# Patient Record
Sex: Male | Born: 1980 | Race: White | Hispanic: No | Smoking: Never smoker
Health system: Southern US, Community
[De-identification: ages and names within clinical notes are randomized; demographics above are authoritative.]

## PROBLEM LIST (undated history)

## (undated) DIAGNOSIS — G4733 Obstructive sleep apnea (adult) (pediatric): Secondary | ICD-10-CM

## (undated) DIAGNOSIS — M84376A Stress fracture, unspecified foot, initial encounter for fracture: Secondary | ICD-10-CM

## (undated) DIAGNOSIS — K219 Gastro-esophageal reflux disease without esophagitis: Secondary | ICD-10-CM

## (undated) DIAGNOSIS — M5412 Radiculopathy, cervical region: Secondary | ICD-10-CM

## (undated) DIAGNOSIS — E039 Hypothyroidism, unspecified: Secondary | ICD-10-CM

## (undated) DIAGNOSIS — E079 Disorder of thyroid, unspecified: Secondary | ICD-10-CM

## (undated) DIAGNOSIS — K760 Fatty (change of) liver, not elsewhere classified: Secondary | ICD-10-CM

## (undated) DIAGNOSIS — M25512 Pain in left shoulder: Secondary | ICD-10-CM

## (undated) DIAGNOSIS — K828 Other specified diseases of gallbladder: Secondary | ICD-10-CM

## (undated) DIAGNOSIS — R109 Unspecified abdominal pain: Secondary | ICD-10-CM

## (undated) DIAGNOSIS — Z8661 Personal history of infections of the central nervous system: Secondary | ICD-10-CM

## (undated) HISTORY — DX: Fatty (change of) liver, not elsewhere classified: K76.0

## (undated) HISTORY — DX: Pain in left shoulder: M25.512

## (undated) HISTORY — PX: ACHILLES TENDON SURGERY: SHX542

## (undated) HISTORY — DX: Obstructive sleep apnea (adult) (pediatric): G47.33

## (undated) HISTORY — DX: Unspecified abdominal pain: R10.9

## (undated) HISTORY — DX: Hypothyroidism, unspecified: E03.9

## (undated) HISTORY — DX: Radiculopathy, cervical region: M54.12

## (undated) HISTORY — DX: Other specified diseases of gallbladder: K82.8

## (undated) HISTORY — DX: Disorder of thyroid, unspecified: E07.9

## (undated) HISTORY — DX: Gastro-esophageal reflux disease without esophagitis: K21.9

## (undated) HISTORY — DX: Morbid (severe) obesity due to excess calories: E66.01

## (undated) HISTORY — DX: Personal history of infections of the central nervous system: Z86.61

## (undated) HISTORY — DX: Stress fracture, unspecified foot, initial encounter for fracture: M84.376A

---

## 2016-08-04 ENCOUNTER — Emergency Department (HOSPITAL_COMMUNITY): Payer: BLUE CROSS/BLUE SHIELD

## 2016-08-04 ENCOUNTER — Encounter (HOSPITAL_COMMUNITY): Payer: Self-pay | Admitting: Emergency Medicine

## 2016-08-04 ENCOUNTER — Emergency Department (HOSPITAL_COMMUNITY)
Admission: EM | Admit: 2016-08-04 | Discharge: 2016-08-04 | Disposition: A | Discharge status: Home / Self Care | Payer: BLUE CROSS/BLUE SHIELD | Attending: Emergency Medicine | Admitting: Emergency Medicine

## 2016-08-04 DIAGNOSIS — Y939 Activity, unspecified: Secondary | ICD-10-CM | POA: Insufficient documentation

## 2016-08-04 DIAGNOSIS — W208XXA Other cause of strike by thrown, projected or falling object, initial encounter: Secondary | ICD-10-CM | POA: Diagnosis not present

## 2016-08-04 DIAGNOSIS — Y929 Unspecified place or not applicable: Secondary | ICD-10-CM | POA: Diagnosis not present

## 2016-08-04 DIAGNOSIS — S93402A Sprain of unspecified ligament of left ankle, initial encounter: Secondary | ICD-10-CM | POA: Diagnosis not present

## 2016-08-04 DIAGNOSIS — S99912A Unspecified injury of left ankle, initial encounter: Secondary | ICD-10-CM | POA: Diagnosis present

## 2016-08-04 DIAGNOSIS — S9032XA Contusion of left foot, initial encounter: Secondary | ICD-10-CM | POA: Diagnosis not present

## 2016-08-04 DIAGNOSIS — Y999 Unspecified external cause status: Secondary | ICD-10-CM | POA: Insufficient documentation

## 2016-08-04 DIAGNOSIS — S9782XA Crushing injury of left foot, initial encounter: Secondary | ICD-10-CM

## 2016-08-04 MED ORDER — IBUPROFEN 800 MG PO TABS: 800.0000 mg | ORAL_TABLET | Freq: Three times a day (TID) | ORAL | 0 refills | Status: DC

## 2016-08-04 MED ORDER — DIAZEPAM 5 MG/ML IJ SOLN
2.5000 mg | Freq: Once | INTRAMUSCULAR | Status: AC
  Administered 2016-08-04: 2.5 mg via INTRAMUSCULAR
  Filled 2016-08-04: qty 2

## 2016-08-04 MED ORDER — CYCLOBENZAPRINE HCL 10 MG PO TABS: 10.0000 mg | ORAL_TABLET | Freq: Two times a day (BID) | ORAL | 0 refills | Status: DC | PRN

## 2016-08-04 MED ORDER — OXYCODONE-ACETAMINOPHEN 5-325 MG PO TABS
1.0000 | ORAL_TABLET | Freq: Once | ORAL | Status: AC
  Administered 2016-08-04: 1 via ORAL
  Filled 2016-08-04: qty 1

## 2016-08-04 MED ORDER — OXYCODONE-ACETAMINOPHEN 5-325 MG PO TABS: 1.0000 | ORAL_TABLET | ORAL | 0 refills | Status: DC | PRN

## 2016-08-04 NOTE — ED Provider Notes (Signed)
[facility]-EMERGENCY DEPT Provider Note   CSN: [id number] Arrival date & time: 08/04/16  1813  By signing my name below, I, Teofilo PodMatthew P. Jamison, attest that this documentation has been prepared under the direction and in the presence of Danelle BerryLeisa Shon Mansouri, PA-C. Electronically Signed: Teofilo PodMatthew P. Jamison, ED Scribe. 08/04/2016. 7:53 PM.    History   Chief Complaint Chief Complaint  Patient presents with  . Foot Injury    The history is provided by the patient. No language interpreter was used.   HPI Comments:  Eugene Butler is a 35 y.o. male who presents to the Emergency Department s/p an injury to his left foot. Pt reports that a 10,000 lb log pinned his foot up against a curb. Pt then fell to his side after his foot became released. Pt was wearing a work boot at the time.  He complains of severe pain, 10/10, sudden onset, constant and unchanged since injury, described as sharp, burning, stabbing, located over the lateral side of her left foot with radiation of pain to all his toes, associated with numbness of toes, and swelling over the top of his foot.  He reports he is unable to stand on his left foot and states he cannot move his foot or toes. No alleviating factors noted. Pt denies other associated symptoms.  He denies left ankle pain.    History reviewed. No pertinent past medical history.  There are no active problems to display for this patient.   History reviewed. No pertinent surgical history.     Home Medications    Prior to Admission medications   Not on File    Family History No family history on file.  Social History Social History  Substance Use Topics  . Smoking status: Never Smoker  . Smokeless tobacco: Current User    Types: Snuff  . Alcohol use No     Allergies   Patient has no allergy information on record.   Review of Systems Review of Systems 10 Systems reviewed and are negative for acute change except as noted in the HPI.   Physical  Exam Updated Vital Signs There were no vitals taken for this visit.  Physical Exam  Constitutional: He is oriented to person, place, and time. He appears well-developed and well-nourished. No distress.  HENT:  Head: Normocephalic and atraumatic.  Right Ear: External ear normal.  Left Ear: External ear normal.  Nose: Nose normal.  Mouth/Throat: Oropharynx is clear and moist. No oropharyngeal exudate.  Eyes: Conjunctivae and EOM are normal. Pupils are equal, round, and reactive to light. Right eye exhibits no discharge. Left eye exhibits no discharge. No scleral icterus.  Neck: Normal range of motion. Neck supple. No JVD present. No tracheal deviation present.  Cardiovascular: Normal rate and regular rhythm.   Pulmonary/Chest: Effort normal and breath sounds normal. No stridor. No respiratory distress.  Musculoskeletal: He exhibits edema and tenderness. He exhibits no deformity.       Left ankle: He exhibits swelling and ecchymosis. He exhibits normal range of motion, no deformity, no laceration and normal pulse. Tenderness. Head of 5th metatarsal tenderness found. No lateral malleolus, no medial malleolus and no proximal fibula tenderness found. Achilles tendon normal.       Left foot: There is tenderness, bony tenderness and swelling. There is normal range of motion, normal capillary refill, no crepitus, no deformity and no laceration.  Swelling and bruising to lateral dorsum of left foot and to lateral malleolus.  Toes normal in appearance, pt able to  move them, normal capillary refill (brisk, <2s), generalized ttp, normal ankle ROM   Lymphadenopathy:    He has no cervical adenopathy.  Neurological: He is alert and oriented to person, place, and time. He exhibits normal muscle tone. Coordination normal.  Skin: Skin is warm and dry. No rash noted. He is not diaphoretic. No erythema. No pallor.  Psychiatric: He has a normal mood and affect. His behavior is normal. Judgment and thought content  normal.  Nursing note and vitals reviewed.    ED Treatments / Results  DIAGNOSTIC STUDIES:  Oxygen Saturation is 100% on RA, normal by my interpretation.    COORDINATION OF CARE:  7:53 PM Discussed treatment plan with pt at bedside and pt agreed to plan.   Labs (all labs ordered are listed, but only abnormal results are displayed) Labs Reviewed - No data to display  EKG  EKG Interpretation None       Radiology Dg Foot Complete Left  Result Date: 08/04/2016 CLINICAL DATA:  Lateral foot pain after log fell on the foot. EXAM: LEFT FOOT - COMPLETE 3+ VIEW COMPARISON:  None. FINDINGS: There is no evidence of fracture or dislocation. An 11 mm plantar calcaneal enthesophyte is noted with tiny dorsal calcaneal enthesophyte. There is lateral soft tissue swelling at the ankle and midfoot. No radiopaque foreign body. IMPRESSION: Soft tissue swelling over the lateral aspect of the ankle and foot. No acute fracture nor bone destruction. Calcaneal enthesophytes. Electronically Signed   By: Tollie Ethavid  Kwon M.D.   On: 08/04/2016 19:43    Procedures Procedures (including critical care time)  Medications Ordered in ED Medications - No data to display   Initial Impression / Assessment and Plan / ED Course  I have reviewed the triage vital signs and the nursing notes.  Pertinent labs & imaging results that were available during my care of the patient were reviewed by me and considered in my medical decision making (see chart for details).  Clinical Course    Patient X-Ray negative for obvious fracture or dislocation. Pain managed in ED. Pt advised to follow up with orthopedics.  Patient given brace and crutches in ED, conservative therapy recommended and discussed. Patient will be dc home & is agreeable with above plan.  Final Clinical Impressions(s) / ED Diagnoses   Final diagnoses:  Injury, crush, foot, left, initial encounter  Sprain of left ankle, unspecified ligament, initial  encounter    New Prescriptions Discharge Medication List as of 08/04/2016  8:14 PM    START taking these medications   Details  cyclobenzaprine (FLEXERIL) 10 MG tablet Take 1 tablet (10 mg total) by mouth 2 (two) times daily as needed for muscle spasms., Starting Thu 08/04/2016, Print    ibuprofen (ADVIL,MOTRIN) 800 MG tablet Take 1 tablet (800 mg total) by mouth 3 (three) times daily., Starting Thu 08/04/2016, Print    oxyCODONE-acetaminophen (PERCOCET) 5-325 MG tablet Take 1 tablet by mouth every 4 (four) hours as needed., Starting Thu 08/04/2016, Print       I personally performed the services described in this documentation, which was scribed in my presence. The recorded information has been reviewed and is accurate.       Danelle BerryLeisa Indiah Heyden, PA-C 08/10/16 2211    Loren Raceravid Yelverton, MD 08/12/16 [phone]

## 2016-08-04 NOTE — Discharge Instructions (Signed)
Strict Non-Weight-Bearing, use crutches and ankle brace if you can tolerate it.  Follow up with the ortho surgeon for further evaluation and treatment

## 2016-08-04 NOTE — ED Triage Notes (Signed)
Per pt, states log fell on left foot-increased pain

## 2016-08-05 ENCOUNTER — Ambulatory Visit (INDEPENDENT_AMBULATORY_CARE_PROVIDER_SITE_OTHER): Payer: BLUE CROSS/BLUE SHIELD | Admitting: Orthopedic Surgery

## 2016-08-05 ENCOUNTER — Encounter (INDEPENDENT_AMBULATORY_CARE_PROVIDER_SITE_OTHER): Payer: Self-pay | Admitting: Orthopedic Surgery

## 2016-08-05 ENCOUNTER — Ambulatory Visit (INDEPENDENT_AMBULATORY_CARE_PROVIDER_SITE_OTHER): Payer: Self-pay

## 2016-08-05 VITALS — Ht 73.0 in | Wt 276.0 lb

## 2016-08-05 DIAGNOSIS — M25572 Pain in left ankle and joints of left foot: Secondary | ICD-10-CM | POA: Diagnosis not present

## 2016-08-05 DIAGNOSIS — S9782XA Crushing injury of left foot, initial encounter: Secondary | ICD-10-CM

## 2016-08-05 NOTE — Progress Notes (Signed)
Office Visit Note   Patient: Eugene Butler           Date of Birth: Dec 09, 1980           MRN: [id number] Visit Date: 08/05/2016              Requested by: No referring provider defined for this encounter. PCP: Pcp Not In System   Assessment & Plan: Visit Diagnoses:  1. Crush injury of left foot, initial encounter   2. Pain in left ankle and joints of left foot   Concern for possible development of compartment syndrome.  Plan: I had a long discussion with the patient and his brother regarding the serious nature of the crush injury and risk for compartment syndrome.Marland Kitchen. Despite that the fact there are no broken bones discussed the risk of swelling cutting off the circulation to his muscle and potential muscle death and potential complications from the muscle death. Discussed the importance of elevating his foot level with his heart using ice for 15 minutes of the time. Recommended Aleve 2 by mouth twice a day. Discussed that he should not even do light duty work that he needs to be at home with his foot elevated just slightly above the heart. Discussed that if he has any blisters develop or any increased swelling he would need to go the emergency room urgently for evaluation for surgical fasciotomies. Will follow-up on Monday. Discussed that even without swelling with a severe crush injury patient could still have muscle death. There is no indication at this time of any tendon injury.  Follow-Up Instructions: Return in about 3 days (around 08/08/2016).   Orders:  Orders Placed This Encounter  Procedures  . XR Foot Complete Left  . XR Ankle Complete Left   No orders of the defined types were placed in this encounter.     Procedures: No procedures performed   Clinical Data: No additional findings.   Subjective: No chief complaint on file.   Patient presents with left ankle/foot injury. His ankle was pinned against 15,000lb log between the curb. [facility], xrays were obtained and were negative for fracture. He has significant bruising and swelling lateral ankle and foot. He states most of the pain in his ankle. He is nonweightbearing with crutches. He is wearing ASO today. He was given a prescription for oxycodone and flexeril but was not able to fill due to time of injury.   The medial side of his foot was against the curb the lateral side of his foot was pinned with the log.  Review of Systems   Objective: Vital Signs: Ht 6\' 1"  (1.854 m)   Wt 276 lb (125.2 kg)   BMI 36.41 kg/m   Physical Exam on examination patient is alert oriented no adenopathy well-dressed normal affect normal respiratory effort. Examination he does not have a palpable pulse secondary to swelling a Doppler was used and he has a strong triphasic dorsalis pedis and posterior tibial pulse. He can actively wiggle his toes and he has passive range of motion of the toes as well there is also passive range of motion of the ankle. Patient subjectively has global numbness around the foot and ankle. There is some bruising laterally but there is a minimal amount of swelling. There is no open ulcers there is no blisters.  Ortho Exam  Specialty Comments:  No specialty comments available.  Imaging: Dg Foot Complete Left  Result Date: 08/04/2016 CLINICAL DATA:  Lateral  foot pain after log fell on the foot. EXAM: LEFT FOOT - COMPLETE 3+ VIEW COMPARISON:  None. FINDINGS: There is no evidence of fracture or dislocation. An 11 mm plantar calcaneal enthesophyte is noted with tiny dorsal calcaneal enthesophyte. There is lateral soft tissue swelling at the ankle and midfoot. No radiopaque foreign body. IMPRESSION: Soft tissue swelling over the lateral aspect of the ankle and foot. No acute fracture nor bone destruction. Calcaneal enthesophytes. Electronically Signed   By: Tollie Ethavid  Kwon M.D.   On: 08/04/2016 19:43   Xr Ankle Complete Left  Result Date: 08/05/2016 2 view radiographs  of the left ankle shows no evidence of refracture no widening of the syndesmosis. No osteochondral injury.  Xr Foot Complete Left  Result Date: 08/05/2016 Three-view radiographs of the left foot shows no evidence of a Lisfranc injury no fractures no dislocations.    PMFS History: There are no active problems to display for this patient.  No past medical history on file.  No family history on file.  No past surgical history on file. Social History   Occupational History  . Not on file.   Social History Main Topics  . Smoking status: Never Smoker  . Smokeless tobacco: Current User    Types: Snuff  . Alcohol use No  . Drug use: Unknown  . Sexual activity: Not on file

## 2016-08-08 ENCOUNTER — Ambulatory Visit (INDEPENDENT_AMBULATORY_CARE_PROVIDER_SITE_OTHER): Payer: BLUE CROSS/BLUE SHIELD | Admitting: Orthopedic Surgery

## 2016-08-08 ENCOUNTER — Encounter (INDEPENDENT_AMBULATORY_CARE_PROVIDER_SITE_OTHER): Payer: Self-pay | Admitting: Orthopedic Surgery

## 2016-08-08 VITALS — Ht 73.0 in | Wt 276.0 lb

## 2016-08-08 DIAGNOSIS — G90522 Complex regional pain syndrome I of left lower limb: Secondary | ICD-10-CM | POA: Diagnosis not present

## 2016-08-08 MED ORDER — GABAPENTIN 300 MG PO CAPS: 300.0000 mg | ORAL_CAPSULE | Freq: Three times a day (TID) | ORAL | 3 refills | Status: DC

## 2016-08-08 MED ORDER — AMITRIPTYLINE HCL 25 MG PO TABS: 25.0000 mg | ORAL_TABLET | Freq: Every day | ORAL | 3 refills | Status: DC

## 2016-08-08 MED ORDER — DULOXETINE HCL 30 MG PO CPEP: 30.0000 mg | ORAL_CAPSULE | Freq: Every day | ORAL | 3 refills | Status: DC

## 2016-08-08 NOTE — Progress Notes (Signed)
Office Visit Note   Patient: Eugene Butler           Date of Birth: [birth year]           MRN: [id number] Visit Date: 08/08/2016              Requested by: No referring provider defined for this encounter. PCP: Pcp Not In System   Assessment & Plan: Visit Diagnoses:  1. Complex regional pain syndrome i of left lower limb     Plan: Compartment pressures were checked and these were within normal limits. Patient does have increasing nerve pain in his foot. Recommended proceeding with aggressive medical management for his complex regional pain syndrome we will start him on Elavil Cymbalta and Neurontin. Follow-up next week he will call us obesity complications or problems with the medication. He will call physician increased swelling. Continue elevation continue ice continued nonweightbearing left lower extremity. I had Dr. Roda ShuttersXU also evaluate the patient and his diagnosis was similar that patient's symptoms were more from complex regional pain then possible compartment syndrome.  Follow-Up Instructions: Return in about 1 week (around 08/15/2016).   Orders:  No orders of the defined types were placed in this encounter.  No orders of the defined types were placed in this encounter.     Procedures: No procedures performed   Clinical Data: No additional findings.   Subjective: Chief Complaint  Patient presents with  . Left Foot - Follow-up    Left foot crush injury 08/04/16    Patient is here for 3 day follow up left foot crush injury. Date of injury 08/04/16. Patient having sharp pains from heel to bottom of knee, sometimes has burning sensation. He has numbness bottom of foot. He has bruising still laterally. Taking Aleve, was taking oxycodone but this is not touching the pain.     Review of Systems   Objective: Vital Signs: Ht 6\' 1"  (1.854 m)   Wt 276 lb (125.2 kg)   BMI 36.41 kg/m   Physical Exam examination patient's left lower extremity the swelling has  decreased. There is wrinkling of the skin of the foot and ankle and toes. He has decreased pain with passive range of motion of the ankle and essentially has no pain with passive range of motion of the toes he can actively move the ankle and toes. He has exquisite tenderness to palpation plantar medially and laterally over the left foot. There is no blistering of the skin there is some bruising medially and bruising laterally. He has good capillary refill. Previous Doppler showed triphasic flow. The calf is soft minimal tenderness to palpation. No blistering in the calf minimal swelling in the calf. Patient does have increased pain with light touch laterally over his foot. Comparison to the right foot shows very minimal swelling compared to the right foot.  The Stryker compartment pressure machine was used and this was used to evaluate the 2 most painful areas of his foot the plantar abductor muscle group medially as well as the fourth webspace laterally. After informed consent and sterile prepping with Betadine the Stryker compartment pressure monitor was used in these 2 location. The plantar medial compartment was 19-20 mm and the dorsal lateral compartment was 13 mm. The Stryker equipment showed no signs of compartment syndrome at this time.  Ortho Exam  Specialty Comments:  No specialty comments available.  Imaging: No results found.   PMFS History: There are no active problems to display for this patient.  History reviewed.  No pertinent past medical history.  History reviewed. No pertinent family history.  History reviewed. No pertinent surgical history. Social History   Occupational History  . Not on file.   Social History Main Topics  . Smoking status: Never Smoker  . Smokeless tobacco: Current User    Types: Snuff  . Alcohol use No  . Drug use: Unknown  . Sexual activity: Not on file

## 2016-08-10 ENCOUNTER — Encounter (INDEPENDENT_AMBULATORY_CARE_PROVIDER_SITE_OTHER): Payer: Self-pay | Admitting: Orthopedic Surgery

## 2016-08-15 ENCOUNTER — Encounter (INDEPENDENT_AMBULATORY_CARE_PROVIDER_SITE_OTHER): Payer: Self-pay | Admitting: Orthopedic Surgery

## 2016-08-15 ENCOUNTER — Ambulatory Visit (INDEPENDENT_AMBULATORY_CARE_PROVIDER_SITE_OTHER): Payer: BLUE CROSS/BLUE SHIELD | Admitting: Orthopedic Surgery

## 2016-08-15 VITALS — Ht 73.0 in | Wt 276.0 lb

## 2016-08-15 DIAGNOSIS — S9782XD Crushing injury of left foot, subsequent encounter: Secondary | ICD-10-CM | POA: Insufficient documentation

## 2016-08-15 DIAGNOSIS — S9782XS Crushing injury of left foot, sequela: Secondary | ICD-10-CM | POA: Diagnosis not present

## 2016-08-15 NOTE — Progress Notes (Signed)
   Office Visit Note   Patient: Eugene Butler           Date of Birth: 31-Jan-1981           MRN: [id number] Visit Date: 08/15/2016              Requested by: No referring provider defined for this encounter. PCP: Pcp Not In System   Assessment & Plan: Visit Diagnoses:  1. Crush injury of foot, left, sequela     Plan: Patient's dystrophy is almost completely resolved with the medication he states that most the pain is gone but he still has sharp areas of pain. We will request an MRI scan of the foot and ankle to further evaluate the ligaments tendons and bone for occult injury.  Follow-Up Instructions: Return in about 1 week (around 08/22/2016).   Orders:  No orders of the defined types were placed in this encounter.  No orders of the defined types were placed in this encounter.     Procedures: No procedures performed   Clinical Data: No additional findings.   Subjective: Chief Complaint  Patient presents with  . Left Foot - Follow-up    S/p crush injury 08/04/16    Patient is following up status post crush injury on 08/04/16 of left foot. Patient is nonweightbearing with crutches. He was placed on elavil, cymbalta, neurontin last office visit. He feels this is helping some. He was instructed to do dorsiflexion exercises. He stopped these for a day due to increase in swelling. He does complain of tightness bottom of his foot.     Review of Systems   Objective: Vital Signs: Ht 6\' 1"  (1.854 m)   Wt 276 lb (125.2 kg)   BMI 36.41 kg/m   Physical Exam examination patient's foot looks much better with swelling there is no hypersensitivity to light touch at this time. Patient has a palpable pulse there is wrinkling of the skin he still has tightness of the ankle and the importance of dorsiflexion exercises was discussed. Discussed the risk of developing equinus contracture that could possibly require surgical intervention. Patient still has some swelling and pain  laterally over the foot and ankle. There is a concern for possible ligamentous tendon or occult bony injury. Patient denies tobacco use. Patient can actively wiggle his toes and move his ankle without pain.  Ortho Exam  Specialty Comments:  No specialty comments available.  Imaging: No results found.   PMFS History: Patient Active Problem List   Diagnosis Date Noted  . Crush injury of foot, left, sequela 08/15/2016   History reviewed. No pertinent past medical history.  History reviewed. No pertinent family history.  History reviewed. No pertinent surgical history. Social History   Occupational History  . Not on file.   Social History Main Topics  . Smoking status: Never Smoker  . Smokeless tobacco: Current User    Types: Snuff  . Alcohol use No  . Drug use: Unknown  . Sexual activity: Not on file

## 2016-08-22 ENCOUNTER — Encounter (INDEPENDENT_AMBULATORY_CARE_PROVIDER_SITE_OTHER): Payer: Self-pay | Admitting: Orthopedic Surgery

## 2016-08-22 ENCOUNTER — Ambulatory Visit (INDEPENDENT_AMBULATORY_CARE_PROVIDER_SITE_OTHER): Payer: BLUE CROSS/BLUE SHIELD | Admitting: Orthopedic Surgery

## 2016-08-22 DIAGNOSIS — S9782XD Crushing injury of left foot, subsequent encounter: Secondary | ICD-10-CM | POA: Diagnosis not present

## 2016-08-22 NOTE — Progress Notes (Signed)
   Office Visit Note   Patient: Eugene Butler           Date of Birth: 06/04/1981           MRN: [id number] Visit Date: 08/22/2016 Requested by: No referring provider defined for this encounter. PCP: Pcp Not In System  Subjective: Chief Complaint  Patient presents with  . Left Foot - Injury, Follow-up    Patient returns s/p crush injury 08/04/16, doing ok.  He is still taking gabapentin, and is out of ibuprofen now.  He is still having some pain.  He is NWB left LE on crutches.    Injury                 Review of Systems   Assessment & Plan: Visit Diagnoses:  1. Crush injury of left foot, subsequent encounter     Plan: Patient continues show improvement but does have decreased range of motion he was [facility] [address] for range of motion modalities of the foot and ankle on the left. Follow-up after MRI scan obtained this Friday.  Follow-Up Instructions: Return in about 1 week (around 08/29/2016).   Orders:  No orders of the defined types were placed in this encounter.  No orders of the defined types were placed in this encounter.     Procedures: No procedures performed   Clinical Data: No additional findings.  Objective: Vital Signs: There were no vitals taken for this visit.  Physical Exam on examination patient is alert oriented no adenopathy well-dressed normal affect rest whenever he does have an antalgic gait the swelling continued resolve in the left foot and ankle. He still has decreased range of motion of the ankle and toes. He has active motion of the ankle and toes. There is no skin breakdown there is no cellulitis there is no ulcers no signs of infection. His calf is soft no signs of compartment syndrome.  Ortho Exam  Specialty Comments:  No specialty comments available.  Imaging: No results found.   PMFS History: Patient Active Problem List   Diagnosis Date  Noted  . Crush injury of left foot, subsequent encounter 08/15/2016   No past medical history on file.  No family history on file.  No past surgical history on file. Social History   Occupational History  . Not on file.   Social History Main Topics  . Smoking status: Never Smoker  . Smokeless tobacco: Current User    Types: Snuff  . Alcohol use No  . Drug use: Unknown  . Sexual activity: Not on file

## 2016-08-26 ENCOUNTER — Other Ambulatory Visit: Payer: BLUE CROSS/BLUE SHIELD

## 2016-09-01 ENCOUNTER — Ambulatory Visit (INDEPENDENT_AMBULATORY_CARE_PROVIDER_SITE_OTHER): Payer: BLUE CROSS/BLUE SHIELD | Admitting: Orthopedic Surgery

## 2017-05-05 ENCOUNTER — Emergency Department (HOSPITAL_BASED_OUTPATIENT_CLINIC_OR_DEPARTMENT_OTHER)
Admission: EM | Admit: 2017-05-05 | Discharge: 2017-05-05 | Disposition: A | Discharge status: Home / Self Care | Payer: BLUE CROSS/BLUE SHIELD | Attending: Emergency Medicine | Admitting: Emergency Medicine

## 2017-05-05 ENCOUNTER — Encounter (HOSPITAL_BASED_OUTPATIENT_CLINIC_OR_DEPARTMENT_OTHER): Payer: Self-pay

## 2017-05-05 DIAGNOSIS — L259 Unspecified contact dermatitis, unspecified cause: Secondary | ICD-10-CM | POA: Insufficient documentation

## 2017-05-05 DIAGNOSIS — F17290 Nicotine dependence, other tobacco product, uncomplicated: Secondary | ICD-10-CM | POA: Diagnosis not present

## 2017-05-05 DIAGNOSIS — L309 Dermatitis, unspecified: Secondary | ICD-10-CM

## 2017-05-05 DIAGNOSIS — L298 Other pruritus: Secondary | ICD-10-CM | POA: Diagnosis present

## 2017-05-05 MED ORDER — HYDROXYZINE HCL 25 MG PO TABS
ORAL_TABLET | ORAL | Status: AC
  Filled 2017-05-05: qty 1

## 2017-05-05 MED ORDER — HYDROXYZINE HCL 25 MG PO TABS: 25.0000 mg | ORAL_TABLET | Freq: Four times a day (QID) | ORAL | 0 refills | Status: DC

## 2017-05-05 MED ORDER — HYDROXYZINE HCL 25 MG PO TABS
25.0000 mg | ORAL_TABLET | Freq: Once | ORAL | Status: AC
  Administered 2017-05-05: 25 mg via ORAL

## 2017-05-05 MED ORDER — BETAMETHASONE DIPROPIONATE 0.05 % EX OINT: TOPICAL_OINTMENT | Freq: Two times a day (BID) | CUTANEOUS | 0 refills | Status: DC

## 2017-05-05 NOTE — ED Notes (Signed)
ED Provider at bedside. 

## 2017-05-05 NOTE — ED Triage Notes (Signed)
C/o itching, redness to both hands x 3 hours-NAD-steady gait

## 2017-05-06 NOTE — ED Provider Notes (Signed)
[facility] Provider [facility]   CSN: [id number] Arrival date & time: 05/05/17  2203     History   Chief Complaint Chief Complaint  Patient presents with  . Pruritis    HPI Eugene Butler is a 36 y.o. male.  Patient is an Personnel officer. Wears normal work-type gloves. Developed pruritic, erythematous vesicular like rash on fingers of both hands earlier this evening.    Rash   This is a new problem. The current episode started 3 to 5 hours ago. The problem has not changed since onset.The problem is associated with an unknown factor. There has been no fever. The rash is present on the right hand and left hand. The patient is experiencing no pain. Associated symptoms include blisters and itching.    History reviewed. No pertinent past medical history.  Patient Active Problem List   Diagnosis Date Noted  . Crush injury of left foot, subsequent encounter 08/15/2016    History reviewed. No pertinent surgical history.     Home Medications    Prior to Admission medications   Medication Sig Start Date End Date Taking? Authorizing Provider  betamethasone dipropionate (DIPROLENE) 0.05 % ointment Apply topically 2 (two) times daily. 05/05/17   Felicie Morn, NP  hydrOXYzine (ATARAX/VISTARIL) 25 MG tablet Take 1 tablet (25 mg total) by mouth every 6 (six) hours. 05/05/17   Felicie Morn, NP    Family History No family history on file.  Social History Social History  Substance Use Topics  . Smoking status: Never Smoker  . Smokeless tobacco: Current User    Types: Snuff  . Alcohol use Yes     Comment: occ     Allergies   Pertussis vaccines   Review of Systems Review of Systems  Skin: Positive for itching and rash.  All other systems reviewed and are negative.    Physical Exam Updated Vital Signs BP 137/78 (BP Location: Left Arm)   Pulse 82   Temp 98.2 F (36.8 C) (Oral)   Resp 20   Ht 6\' 1"  (1.854 m)   Wt 126.6 kg (279 lb)   SpO2 100%   BMI 36.81  kg/m   Physical Exam  Constitutional: He is oriented to person, place, and time. He appears well-developed and well-nourished. No distress.  HENT:  Head: Normocephalic.  Eyes: Conjunctivae are normal.  Neck: Neck supple.  Cardiovascular: Normal rate and regular rhythm.   Pulmonary/Chest: Effort normal and breath sounds normal.  Abdominal: Soft. Bowel sounds are normal.  Musculoskeletal: Normal range of motion.  Neurological: He is alert and oriented to person, place, and time.  Skin: Skin is warm and dry. Rash noted. There is erythema.  Psychiatric: He has a normal mood and affect.  Nursing note and vitals reviewed.    ED Treatments / Results  Labs (all labs ordered are listed, but only abnormal results are displayed) Labs Reviewed - No data to display  EKG  EKG Interpretation None       Radiology No results found.  Procedures Procedures (including critical care time)  Medications Ordered in ED Medications  hydrOXYzine (ATARAX/VISTARIL) 25 MG tablet (not administered)  hydrOXYzine (ATARAX/VISTARIL) tablet 25 mg (25 mg Oral Given 05/05/17 2249)     Initial Impression / Assessment and Plan / ED Course  I have reviewed the triage vital signs and the nursing notes.  Pertinent labs & imaging results that were available during my care of the patient were reviewed by me and considered in my medical decision making (see  chart for details).        Patient with hand dermatitis. Unknown trigger. Will treat with atarax and diprolene.  No signs of secondary infection. Follow up with PCP in 2-3 days. Return precautions discussed. Pt is safe for discharge at this time.     Final Clinical Impressions(s) / ED Diagnoses   Final diagnoses:  Hand dermatitis    New Prescriptions Discharge Medication List as of 05/05/2017 10:52 PM    START taking these medications   Details  betamethasone dipropionate (DIPROLENE) 0.05 % ointment Apply topically 2 (two) times daily.,  Starting Fri 05/05/2017, Print    hydrOXYzine (ATARAX/VISTARIL) 25 MG tablet Take 1 tablet (25 mg total) by mouth every 6 (six) hours., Starting Fri 05/05/2017, Print         Felicie Morn, NP 05/06/17 1610    Doug Sou, MD 05/06/17 1538

## 2018-09-19 HISTORY — PX: APPENDECTOMY: SHX54

## 2020-01-02 HISTORY — PX: APPENDECTOMY: SHX54

## 2020-09-06 ENCOUNTER — Other Ambulatory Visit: Payer: Self-pay

## 2020-09-06 ENCOUNTER — Emergency Department (HOSPITAL_COMMUNITY)
Admission: EM | Admit: 2020-09-06 | Discharge: 2020-09-07 | Discharge status: Against Medical Advice | Payer: Self-pay | Attending: Emergency Medicine | Admitting: Emergency Medicine

## 2020-09-06 ENCOUNTER — Encounter (HOSPITAL_COMMUNITY): Payer: Self-pay | Admitting: *Deleted

## 2020-09-06 DIAGNOSIS — Y990 Civilian activity done for income or pay: Secondary | ICD-10-CM | POA: Insufficient documentation

## 2020-09-06 DIAGNOSIS — W540XXA Bitten by dog, initial encounter: Secondary | ICD-10-CM | POA: Insufficient documentation

## 2020-09-06 DIAGNOSIS — Y9289 Other specified places as the place of occurrence of the external cause: Secondary | ICD-10-CM | POA: Insufficient documentation

## 2020-09-06 DIAGNOSIS — Z23 Encounter for immunization: Secondary | ICD-10-CM | POA: Insufficient documentation

## 2020-09-06 DIAGNOSIS — F17220 Nicotine dependence, chewing tobacco, uncomplicated: Secondary | ICD-10-CM | POA: Insufficient documentation

## 2020-09-06 DIAGNOSIS — Z2839 Other underimmunization status: Secondary | ICD-10-CM

## 2020-09-06 DIAGNOSIS — S81832A Puncture wound without foreign body, left lower leg, initial encounter: Secondary | ICD-10-CM | POA: Insufficient documentation
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
## 2020-09-07 ENCOUNTER — Emergency Department (HOSPITAL_COMMUNITY): Payer: Self-pay

## 2020-09-07 MED ORDER — RABIES VACCINE, PCEC IM SUSR
1.0000 mL | Freq: Once | INTRAMUSCULAR | Status: DC
  Filled 2020-09-07: qty 1

## 2020-09-07 MED ORDER — AMOXICILLIN-POT CLAVULANATE 875-125 MG PO TABS: 1.0000 | ORAL_TABLET | Freq: Two times a day (BID) | ORAL | 0 refills | Status: DC

## 2020-09-07 MED ORDER — AMOXICILLIN-POT CLAVULANATE 875-125 MG PO TABS
1.0000 | ORAL_TABLET | Freq: Once | ORAL | Status: AC
  Administered 2020-09-07: 1 via ORAL
  Filled 2020-09-07: qty 1

## 2020-09-07 MED ORDER — TETANUS-DIPHTHERIA TOXOIDS TD 5-2 LFU IM INJ: 0.5000 mL | INJECTION | Freq: Once | INTRAMUSCULAR | 0 refills | Status: AC

## 2020-09-07 MED ORDER — RABIES IMMUNE GLOBULIN 150 UNIT/ML IM INJ: 20.0000 [IU]/kg | INJECTION | Freq: Once | INTRAMUSCULAR | Status: DC

## 2020-09-07 MED ORDER — TETANUS-DIPHTHERIA TOXOIDS TD 5-2 LFU IM INJ
0.5000 mL | INJECTION | Freq: Once | INTRAMUSCULAR | Status: AC
  Administered 2020-09-07: 0.5 mL via INTRAMUSCULAR
  Filled 2020-09-07: qty 0.5

## 2020-09-07 NOTE — ED Provider Notes (Signed)
[facility] EMERGENCY DEPARTMENT Provider Note   CSN: [id number] Arrival date & time: 09/06/20  1926     History Chief Complaint  Patient presents with  . Leg Injury  . Animal Bite    Eugene Butler is a 39 y.o. male.  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  While trying to get away he hurt his right Achilles tendon where he has pain at the calcaneal insertion site.  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  Is still able to walk dorsiflex and plantarflex.   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 No pertinent past medical history.  Patient Active Problem List   Diagnosis Date Noted  . Crush injury of left foot, subsequent encounter 08/15/2016    Past Surgical History:  Procedure Laterality Date  . ACHILLES TENDON SURGERY         History reviewed. No pertinent family history.  Social History   Tobacco Use  . Smoking status: Never Smoker  . Smokeless tobacco: Current User    Types: Snuff  Substance Use Topics  . Alcohol use: Yes    Comment: occ    Home Medications Prior to Admission medications   Medication Sig Start Date End Date Taking? Authorizing Provider  amoxicillin-clavulanate (AUGMENTIN) 875-125 MG tablet Take 1 tablet by mouth 2 (two) times daily. One po bid x 7 days 09/07/20   Ilham Roughton, Barbara Cower, MD  betamethasone dipropionate (DIPROLENE) 0.05 % ointment Apply topically 2 (two) times daily. 05/05/17   Felicie Morn, NP  hydrOXYzine (ATARAX/VISTARIL) 25 MG tablet Take 1 tablet (25  mg total) by mouth every 6 (six) hours. 05/05/17   Felicie Morn, NP  tetanus & diphtheria toxoids, adult, (TENIVAC) 5-2 LFU injection Inject 0.5 mLs into the muscle once for 1 dose. 09/07/20 09/07/20  Koren Plyler, Barbara Cower, MD    Allergies    Pertussis vaccines  Review of Systems   Review of Systems  All other systems reviewed and are negative.   Physical Exam Updated Vital Signs BP 122/74   Pulse (!) 58   Temp 98.9 F (37.2 C) (Oral)   Resp 16   Ht 6\' 1"  (1.854 m)   Wt 127.9 kg   SpO2 100%   BMI 37.21 kg/m   Physical Exam Vitals and nursing note reviewed.  Constitutional:      Appearance: He is well-developed and well-nourished.  HENT:     Head: Normocephalic and atraumatic.     Mouth/Throat:     Mouth: Mucous membranes are moist.     Pharynx: Oropharynx is clear.  Eyes:     Pupils: Pupils are equal, round, and reactive to light.  Cardiovascular:     Rate and Rhythm: Normal rate.  Pulmonary:     Effort: Pulmonary effort is normal. No respiratory distress.  Abdominal:     General: Abdomen is flat. There is no distension.  Musculoskeletal:        General: Normal range of motion.     Cervical  back: Normal range of motion.  Skin:    General: Skin is warm and dry.     Comments: Small puncture wound to right lateral leg  Neurological:     General: No focal deficit present.     Mental Status: He is alert.     ED Results / Procedures / Treatments   Labs (all labs ordered are listed, but only abnormal results are displayed) Labs Reviewed - No data to display  EKG None  Radiology DG Tibia/Fibula Left  Result Date: 09/07/2020 CLINICAL DATA:  Animal bite EXAM: LEFT TIBIA AND FIBULA - 2 VIEW COMPARISON:  None. FINDINGS: Reported site of animal bite is not well visualized radiographically. No soft tissue gas or foreign body. No acute osseous injury is identified. IMPRESSION: 1. No acute osseous abnormality. 2. Reported site of animal bite is not well visualized  radiographically. Electronically Signed   By: Kreg Shropshire M.D.   On: 09/07/2020 05:00    Procedures Procedures (including critical care time)  Medications Ordered in ED Medications  rabies immune globulin (HYPERAB/KEDRAB) injection 20 Units/kg (has no administration in time range)  rabies vaccine (RABAVERT) injection 1 mL (has no administration in time range)  amoxicillin-clavulanate (AUGMENTIN) 875-125 MG per tablet 1 tablet (1 tablet Oral Given 09/07/20 7893)    ED Course  I have reviewed the triage vital signs and the nursing notes.  Pertinent labs & imaging results that were available during my care of the patient were reviewed by me and considered in my medical decision making (see chart for details).    MDM Rules/Calculators/A&P                          Refused rabies. Will fu if changes his mind.  Updated tetanus (allergic to pertussis).  No foreign bodies.  No obvious achilles injury. Will fu w/ orthopedist.    Final Clinical Impression(s) / ED Diagnoses Final diagnoses:  Dog bite, initial encounter  Not up to date with tetanus toxoid immunization    Rx / DC Orders ED Discharge Orders         Ordered    tetanus & diphtheria toxoids, adult, (TENIVAC) 5-2 LFU injection   Once        09/07/20 0516    amoxicillin-clavulanate (AUGMENTIN) 875-125 MG tablet  2 times daily        09/07/20 0523           Orianna Biskup, Barbara Cower, MD 09/07/20 [phone]

## 2020-09-07 NOTE — ED Notes (Signed)
Pt to check on status of dog's vaccination status before receiving rabies vaccine. Pt okay to be discharged. Resp even and unlabored. Ambulatory with steady gait.

## 2020-09-07 NOTE — Discharge Instructions (Signed)
If you do not know the vaccine status of the end of the patch you it is recommended you get rabies series and not doing it will put your health risk but you are competent to refuse that vaccination.  If you were to change your mind please return here or urgent care as soon as possible for further management.

## 2021-06-08 ENCOUNTER — Encounter (HOSPITAL_BASED_OUTPATIENT_CLINIC_OR_DEPARTMENT_OTHER): Payer: Self-pay

## 2021-06-08 ENCOUNTER — Other Ambulatory Visit: Payer: Self-pay

## 2021-06-08 DIAGNOSIS — R197 Diarrhea, unspecified: Secondary | ICD-10-CM | POA: Insufficient documentation

## 2021-06-08 DIAGNOSIS — R1110 Vomiting, unspecified: Secondary | ICD-10-CM | POA: Diagnosis not present

## 2021-06-08 DIAGNOSIS — R1013 Epigastric pain: Secondary | ICD-10-CM | POA: Insufficient documentation

## 2021-06-08 DIAGNOSIS — R079 Chest pain, unspecified: Secondary | ICD-10-CM | POA: Diagnosis not present

## 2021-06-08 LAB — COMPREHENSIVE METABOLIC PANEL
ALT: 23 U/L (ref 0–44)
AST: 19 U/L (ref 15–41)
Albumin: 4.4 g/dL (ref 3.5–5.0)
Alkaline Phosphatase: 55 U/L (ref 38–126)
Anion gap: 11 (ref 5–15)
BUN: 17 mg/dL (ref 6–20)
CO2: 22 mmol/L (ref 22–32)
Calcium: 9.6 mg/dL (ref 8.9–10.3)
Chloride: 106 mmol/L (ref 98–111)
Creatinine, Ser: 0.97 mg/dL (ref 0.61–1.24)
GFR, Estimated: >60 mL/min (ref >60)
Glucose, Bld: 98 mg/dL (ref 70–99)
Potassium: 3.9 mmol/L (ref 3.5–5.1)
Sodium: 139 mmol/L (ref 135–145)
Total Bilirubin: 0.3 mg/dL (ref 0.3–1.2)
Total Protein: 7.3 g/dL (ref 6.5–8.1)

## 2021-06-08 LAB — CBC
HCT: 40.6 % (ref 39.0–52.0)
Hemoglobin: 13.8 g/dL (ref 13.0–17.0)
MCH: 29.4 pg (ref 26.0–34.0)
MCHC: 34 g/dL (ref 30.0–36.0)
MCV: 86.4 fL (ref 80.0–100.0)
Platelets: 246 10*3/uL (ref 150–400)
RBC: 4.7 MIL/uL (ref 4.22–5.81)
RDW: 12.6 % (ref 11.5–15.5)
WBC: 8.6 10*3/uL (ref 4.0–10.5)
nRBC: 0 % (ref 0.0–0.2)

## 2021-06-08 LAB — URINALYSIS, ROUTINE W REFLEX MICROSCOPIC
Bilirubin Urine: NEGATIVE
Glucose, UA: NEGATIVE mg/dL
Hgb urine dipstick: NEGATIVE
Ketones, ur: NEGATIVE mg/dL
Leukocytes,Ua: NEGATIVE
Nitrite: NEGATIVE
Protein, ur: NEGATIVE mg/dL
Specific Gravity, Urine: 1.034 — ABNORMAL HIGH (ref 1.005–1.030)
pH: 6 (ref 5.0–8.0)

## 2021-06-08 LAB — LIPASE, BLOOD: Lipase: 26 U/L (ref 11–51)

## 2021-06-08 NOTE — ED Triage Notes (Signed)
Pt reports epigastric pain that radiates to his chest and mid back. Also endorses diarrhea and vomiting since Saturday.   No PMX. Non smoker

## 2021-06-09 ENCOUNTER — Emergency Department (HOSPITAL_BASED_OUTPATIENT_CLINIC_OR_DEPARTMENT_OTHER): Payer: BLUE CROSS/BLUE SHIELD

## 2021-06-09 ENCOUNTER — Emergency Department (HOSPITAL_BASED_OUTPATIENT_CLINIC_OR_DEPARTMENT_OTHER)
Admission: EM | Admit: 2021-06-09 | Discharge: 2021-06-09 | Disposition: A | Discharge status: Home / Self Care | Payer: BLUE CROSS/BLUE SHIELD | Attending: Emergency Medicine | Admitting: Emergency Medicine

## 2021-06-09 DIAGNOSIS — R1013 Epigastric pain: Secondary | ICD-10-CM

## 2021-06-09 DIAGNOSIS — R079 Chest pain, unspecified: Secondary | ICD-10-CM

## 2021-06-09 LAB — TROPONIN I (HIGH SENSITIVITY): Troponin I (High Sensitivity): 3 ng/L (ref <18)

## 2021-06-09 MED ORDER — SUCRALFATE 1 GM/10ML PO SUSP: 1.0000 g | Freq: Three times a day (TID) | ORAL | 0 refills | Status: DC

## 2021-06-09 MED ORDER — HYOSCYAMINE SULFATE 0.125 MG SL SUBL
0.2500 mg | SUBLINGUAL_TABLET | Freq: Once | SUBLINGUAL | Status: AC
  Administered 2021-06-09: 0.25 mg via SUBLINGUAL
  Filled 2021-06-09: qty 2

## 2021-06-09 MED ORDER — LIDOCAINE VISCOUS HCL 2 % MT SOLN
15.0000 mL | Freq: Once | OROMUCOSAL | Status: AC
  Administered 2021-06-09: 15 mL via ORAL
  Filled 2021-06-09: qty 15

## 2021-06-09 MED ORDER — ALUM & MAG HYDROXIDE-SIMETH 200-200-20 MG/5ML PO SUSP
30.0000 mL | Freq: Once | ORAL | Status: AC
  Administered 2021-06-09: 30 mL via ORAL
  Filled 2021-06-09: qty 30

## 2021-06-09 NOTE — ED Provider Notes (Signed)
[facility] EMERGENCY DEPT Provider Note  CSN: [id number] Arrival date & time: 06/08/21 2117  Chief Complaint(s) Abdominal Pain, Emesis, and Diarrhea  HPI Eugene Butler is a 40 y.o. male   The history is provided by the patient.  Abdominal Pain Pain location:  Epigastric Pain quality: aching and stabbing   Pain radiates to:  Chest and back Pain severity:  Moderate Onset quality:  Gradual Duration:  4 days Timing:  Constant Progression:  Waxing and waning Chronicity:  New Relieved by:  Nothing Worsened by:  Eating and vomiting Ineffective treatments:  Antacids Associated symptoms: chest pain, diarrhea, nausea and vomiting   Associated symptoms: no chills, no dysuria, no fever, no hematemesis, no hematochezia and no melena   Emesis Associated symptoms: abdominal pain and diarrhea   Associated symptoms: no chills and no fever   Diarrhea Associated symptoms: abdominal pain and vomiting   Associated symptoms: no chills and no fever    Past Medical History History reviewed. No pertinent past medical history. Patient Active Problem List   Diagnosis Date Noted   Crush injury of left foot, subsequent encounter 08/15/2016   Home Medication(s) Prior to Admission medications   Medication Sig Start Date End Date Taking? Authorizing Provider  amoxicillin-clavulanate (AUGMENTIN) 875-125 MG tablet Take 1 tablet by mouth 2 (two) times daily. One po bid x 7 days 09/07/20   Mesner, Barbara Cower, MD  betamethasone dipropionate (DIPROLENE) 0.05 % ointment Apply topically 2 (two) times daily. 05/05/17   Felicie Morn, NP  hydrOXYzine (ATARAX/VISTARIL) 25 MG tablet Take 1 tablet (25 mg total) by mouth every 6 (six) hours. 05/05/17   Felicie Morn, NP  sucralfate (CARAFATE) 1 GM/10ML suspension Take 10 mLs (1 g total) by mouth 4 (four) times daily -  with meals and at bedtime. 06/09/21 07/09/21 Yes Tylor Courtwright, Amadeo Garnet, MD                                                                                                                                     Past Surgical History Past Surgical History:  Procedure Laterality Date   ACHILLES TENDON SURGERY     Family History History reviewed. No pertinent family history.  Social History Social History   Tobacco Use   Smoking status: Never   Smokeless tobacco: Current    Types: Snuff  Substance Use Topics   Alcohol use: Yes    Comment: occ   Allergies Pertussis vaccines  Review of Systems Review of Systems  Constitutional:  Negative for chills and fever.  Cardiovascular:  Positive for chest pain.  Gastrointestinal:  Positive for abdominal pain, diarrhea, nausea and vomiting. Negative for hematemesis, hematochezia and melena.  Genitourinary:  Negative for dysuria.  All other systems are reviewed and are negative for acute change except as noted in the HPI  Physical Exam Vital Signs  I have reviewed the triage vital signs BP 126/68   Pulse 64   Temp  98.3 F (36.8 C) (Oral)   Resp 18   Ht 6\' 1"  (1.854 m)   Wt 131.1 kg   SpO2 99%   BMI 38.13 kg/m   Physical Exam Vitals reviewed.  Constitutional:      General: He is not in acute distress.    Appearance: He is well-developed. He is not diaphoretic.  HENT:     Head: Normocephalic and atraumatic.     Nose: Nose normal.  Eyes:     General: No scleral icterus.       Right eye: No discharge.        Left eye: No discharge.     Conjunctiva/sclera: Conjunctivae normal.     Pupils: Pupils are equal, round, and reactive to light.  Cardiovascular:     Rate and Rhythm: Normal rate and regular rhythm.     Heart sounds: No murmur heard.   No friction rub. No gallop.  Pulmonary:     Effort: Pulmonary effort is normal. No respiratory distress.     Breath sounds: Normal breath sounds. No stridor. No rales.  Abdominal:     General: There is no distension.     Palpations: Abdomen is soft.     Tenderness: There is abdominal tenderness in the epigastric area. There is  no guarding or rebound.  Musculoskeletal:        General: No tenderness.     Cervical back: Normal range of motion and neck supple.  Skin:    General: Skin is warm and dry.     Findings: No erythema or rash.  Neurological:     Mental Status: He is alert and oriented to person, place, and time.    ED Results and Treatments Labs (all labs ordered are listed, but only abnormal results are displayed) Labs Reviewed  URINALYSIS, ROUTINE W REFLEX MICROSCOPIC - Abnormal; Notable for the following components:      Result Value   Specific Gravity, Urine 1.034 (*)    All other components within normal limits  LIPASE, BLOOD  COMPREHENSIVE METABOLIC PANEL  CBC  TROPONIN I (HIGH SENSITIVITY)  TROPONIN I (HIGH SENSITIVITY)                                                                                                                         EKG  EKG Interpretation  Date/Time:  Tuesday June 08 2021 22:01:34 EDT Ventricular Rate:  70 PR Interval:  152 QRS Duration: 74 QT Interval:  370 QTC Calculation: 399 R Axis:   73 Text Interpretation: Normal sinus rhythm Normal ECG No old tracing to compare Confirmed by [birth year]19-1978 [phone]) on 06/08/2021 11:35:38 PM       Radiology DG Chest Port 1 View  Result Date: 06/09/2021 CLINICAL DATA:  Epigastric pain. EXAM: PORTABLE CHEST 1 VIEW COMPARISON:  Chest radiograph dated 09/13/2011. FINDINGS: The heart size and mediastinal contours are within normal limits. Both lungs are clear. The visualized skeletal structures are unremarkable. IMPRESSION: No active disease. Electronically Signed  By: Elgie Collard M.D.   On: 06/09/2021 01:47    Pertinent labs & imaging results that were available during my care of the patient were reviewed by me and considered in my medical decision making (see MDM for details).  Medications Ordered in ED Medications  alum & mag hydroxide-simeth (MAALOX/MYLANTA) 200-200-20 MG/5ML suspension 30 mL (30 mLs Oral Given  06/09/21 0158)    And  lidocaine (XYLOCAINE) 2 % viscous mouth solution 15 mL (15 mLs Oral Given 06/09/21 0158)  hyoscyamine (LEVSIN SL) SL tablet 0.25 mg (0.25 mg Sublingual Given 06/09/21 0157)                                                                                                                                     Procedures Procedures  (including critical care time)  Medical Decision Making / ED Course I have reviewed the nursing notes for this encounter and the patient's prior records (if available in EHR or on provided paperwork).  Douglass Braithwaite was evaluated in Emergency Department on 06/09/2021 for the symptoms described in the history of present illness. He was evaluated in the context of the global COVID-19 pandemic, which necessitated consideration that the patient might be at risk for infection with the SARS-CoV-2 virus that causes COVID-19. Institutional protocols and algorithms that pertain to the evaluation of patients at risk for COVID-19 are in a state of rapid change based on information released by regulatory bodies including the CDC and federal and state organizations. These policies and algorithms were followed during the patient's care in the ED.     Upper abdominal pain with radiation to the chest. Under regular records patient has a history of peptic ulcer disease. Initial consideration is most consistent with gastritis/esophagitis. Will also obtain screening labs to rule out biliary disease, pancreatitis. Will obtain cardiac work-up to rule out ACS -though I have low suspicion for this.  Low suspicion for pulmonary embolism.  Pertinent labs & imaging results that were available during my care of the patient were reviewed by me and considered in my medical decision making:  EKG without acute ischemic changes or evidence of pericarditis. Troponin negative.  Given the duration of patient's pain the single troponin is sufficient to rule out ACS in this  clinical picture.  Chest x-ray without evidence suggestive of pneumonia, pneumothorax, pneumomediastinum.  No abnormal contour of the mediastinum to suggest dissection. No evidence of acute injuries.  Labs without leukocytosis or anemia.  No significant electrolyte derangements or renal sufficiency.  No evidence of bili obstruction or pancreatitis..  Low suspicion for other serious intra-abdominal inflammatory/infectious process. Pain improved with GI cocktail. Able to tolerate oral intake.   Final Clinical Impression(s) / ED Diagnoses Final diagnoses:  Chest pain  Epigastric abdominal pain   The patient appears reasonably screened and/or stabilized for discharge and I doubt any other medical condition or other [facility] requiring further screening, evaluation, or treatment in  the ED at this time prior to discharge. Safe for discharge with strict return precautions.  Disposition: Discharge  Condition: Good  I have discussed the results, Dx and Tx plan with the patient/family who expressed understanding and agree(s) with the plan. Discharge instructions discussed at length. The patient/family was given strict return precautions who verbalized understanding of the instructions. No further questions at time of discharge.    ED Discharge Orders          Ordered    sucralfate (CARAFATE) 1 GM/10ML suspension  3 times daily with meals & bedtime        06/09/21 0254             Follow Up: Primary care provider  Schedule an appointment as soon as possible for a visit  if you do not have a primary care physician, contact [facility] at [phone] for referral  Willis Modena, MD [address] [place] [address] [phone]  Call  to schedule an appointment for close follow up     This chart was dictated using voice recognition software.  Despite best efforts to proofread,  errors can occur which can change the documentation meaning.    Nira Conn, MD 06/09/21 [phone]

## 2021-07-30 ENCOUNTER — Ambulatory Visit: Payer: BLUE CROSS/BLUE SHIELD | Admitting: Podiatry

## 2021-07-30 ENCOUNTER — Other Ambulatory Visit: Payer: Self-pay

## 2021-07-30 ENCOUNTER — Encounter: Payer: Self-pay | Admitting: Podiatry

## 2021-07-30 DIAGNOSIS — L600 Ingrowing nail: Secondary | ICD-10-CM | POA: Diagnosis not present

## 2021-07-30 NOTE — Patient Instructions (Signed)

## 2021-08-01 NOTE — Progress Notes (Signed)
Subjective:   Patient ID: Eugene Butler, male   DOB: 40 y.o.   MRN: [id number]   HPI Patient presents with a very painful left hallux nail and states that its been going on now for about 3 weeks and has had history of the problem.  States that he has tried to work on it himself and soaks without resolution and does not smoke likes to be active and is moderately obese   Review of Systems  All other systems reviewed and are negative.      Objective:  Physical Exam Vitals and nursing note reviewed.  Constitutional:      Appearance: He is well-developed.  Pulmonary:     Effort: Pulmonary effort is normal.  Musculoskeletal:        General: Normal range of motion.  Skin:    General: Skin is warm.  Neurological:     Mental Status: He is alert.    Neurovascular status intact muscle strength adequate range of motion within normal limits.  Patient is found to have incurvated lateral border left hallux very painful when pressed no active drainage no redness noted currently with trauma to the nailbed.  Good digital perfusion well oriented x3     Assessment:  Ingrown toenail deformity left hallux lateral border with pain     Plan:  H&P reviewed condition discussed treatment options at great length and is opted for surgical correction.  I did explain procedure risk and he signed consent form after review and at this point I infiltrated the left hallux 60 mg Xylocaine Marcaine mixture sterile prep done and using sterile instrumentation removed border exposed matrix applied phenol 3 applications 30 seconds followed by alcohol lavage sterile dressing gave instructions on soaks and to leave dressing on 24 hours but take it off earlier if any throbbing were to occur and encouraged him to call with questions concerns which may arise

## 2021-12-21 DIAGNOSIS — M542 Cervicalgia: Secondary | ICD-10-CM | POA: Insufficient documentation

## 2022-06-01 ENCOUNTER — Encounter: Payer: Self-pay | Admitting: Family Medicine

## 2022-06-02 ENCOUNTER — Encounter: Payer: Self-pay | Admitting: Family Medicine

## 2022-06-02 ENCOUNTER — Ambulatory Visit (INDEPENDENT_AMBULATORY_CARE_PROVIDER_SITE_OTHER): Payer: BLUE CROSS/BLUE SHIELD | Admitting: Family Medicine

## 2022-06-02 ENCOUNTER — Telehealth: Payer: Self-pay | Admitting: Family Medicine

## 2022-06-02 VITALS — BP 113/76 | HR 72 | Temp 98.4°F | Ht 72.75 in | Wt 312.8 lb

## 2022-06-02 DIAGNOSIS — R109 Unspecified abdominal pain: Secondary | ICD-10-CM | POA: Diagnosis not present

## 2022-06-02 DIAGNOSIS — R197 Diarrhea, unspecified: Secondary | ICD-10-CM

## 2022-06-02 DIAGNOSIS — K529 Noninfective gastroenteritis and colitis, unspecified: Secondary | ICD-10-CM | POA: Diagnosis not present

## 2022-06-02 LAB — CBC WITH DIFFERENTIAL/PLATELET
Basophils Absolute: 0.1 10*3/uL (ref 0.0–0.1)
Basophils Relative: 0.7 % (ref 0.0–3.0)
Eosinophils Absolute: 0.1 10*3/uL (ref 0.0–0.7)
Eosinophils Relative: 0.9 % (ref 0.0–5.0)
HCT: 41.4 % (ref 39.0–52.0)
Hemoglobin: 13.9 g/dL (ref 13.0–17.0)
Lymphocytes Relative: 19.7 % (ref 12.0–46.0)
Lymphs Abs: 1.4 10*3/uL (ref 0.7–4.0)
MCHC: 33.5 g/dL (ref 30.0–36.0)
MCV: 86.7 fl (ref 78.0–100.0)
Monocytes Absolute: 0.5 10*3/uL (ref 0.1–1.0)
Monocytes Relative: 7.1 % (ref 3.0–12.0)
Neutro Abs: 5.3 10*3/uL (ref 1.4–7.7)
Neutrophils Relative %: 71.6 % (ref 43.0–77.0)
Platelets: 247 10*3/uL (ref 150.0–400.0)
RBC: 4.78 Mil/uL (ref 4.22–5.81)
RDW: 13.8 % (ref 11.5–15.5)
WBC: 7.3 10*3/uL (ref 4.0–10.5)

## 2022-06-02 LAB — COMPREHENSIVE METABOLIC PANEL
ALT: 21 U/L (ref 0–53)
AST: 15 U/L (ref 0–37)
Albumin: 4.1 g/dL (ref 3.5–5.2)
Alkaline Phosphatase: 60 U/L (ref 39–117)
BUN: 20 mg/dL (ref 6–23)
CO2: 25 mEq/L (ref 19–32)
Calcium: 9.3 mg/dL (ref 8.4–10.5)
Chloride: 103 mEq/L (ref 96–112)
Creatinine, Ser: 1.04 mg/dL (ref 0.40–1.50)
GFR: 90.11 mL/min (ref >60.00)
Glucose, Bld: 89 mg/dL (ref 70–99)
Potassium: 4.2 mEq/L (ref 3.5–5.1)
Sodium: 138 mEq/L (ref 135–145)
Total Bilirubin: 0.4 mg/dL (ref 0.2–1.2)
Total Protein: 7.1 g/dL (ref 6.0–8.3)

## 2022-06-02 LAB — TSH: TSH: 2.45 u[IU]/mL (ref 0.35–5.50)

## 2022-06-02 LAB — H. PYLORI ANTIBODY, IGG: H Pylori IgG: NEGATIVE

## 2022-06-02 LAB — SEDIMENTATION RATE: Sed Rate: 23 mm/hr — ABNORMAL HIGH (ref 0–15)

## 2022-06-02 NOTE — Telephone Encounter (Signed)
Is it okay that I scheduled patient for 4:20 on 06/22/22? He stated at checkout he was told he could come back on a Tuesday at 3:30 pm. If not ok I will call patient and move appt. He is demanding.

## 2022-06-02 NOTE — Progress Notes (Signed)
Office Note 06/02/2022  CC:  Chief Complaint  Patient presents with   Establish Care   HPI:  Eugene Butler is a 41 y.o. male who is to establish care and discuss abdominal concerns. Patient's most recent primary MD: none Old records in EPIC/HL EMR were reviewed prior to or during today's visit.  Describes recurrent epigastric pain radiating up in the substernal area for several years now.  Burning and aching character.  Describes postprandial nausea.  Has GERD with occasional regurgitation, particularly at night. After eating breakfast each morning he will have a watery bowel movement 1 to 2 hours later.  Sounds like he may have a watery BM later in the day, unclear whether related to eating or not.  He says he has about 1 normal formed bowel movement per week. His symptoms seem to occur no matter what he eats. He describes having a work-up for this in the past, including an ultrasound at 1 point and was seen by gastroenterologist in [facility]  He says they did not follow-up with him about results. Most recently he was diagnosed with gastritis and placed on pantoprazole.  He takes this and Tums frequently and says nothing helps.  PMP AWARE reviewed today: Nothing is listed . no red flags.  Past Medical History:  Diagnosis Date   Cervical radiculopathy    Dr. Delilah Shan   GERD (gastroesophageal reflux disease)    History of meningitis    childhood.  Question of seizures related to this, was on tegretol for a while per pt report   Hypothyroidism    Patient reports he was on levothyroxine until he self DC'd this medication the age of 32   Left shoulder pain    Dr. Delilah Shan   Morbid obesity ([place])    OSA (obstructive sleep apnea)    Skin of: Did home sleep study at 1 point but says he was never called about the results   Recurrent abdominal pain    Stress fracture of navicular bone of foot     Past Surgical History:  Procedure Laterality Date   ACHILLES TENDON SURGERY      APPENDECTOMY  01/02/2020    Family History  Problem Relation Age of Onset   Arthritis Mother    Lung cancer Mother    Early death Mother    Kidney disease Brother     Social History   Socioeconomic History   Marital status: Divorced    Spouse name: Not on file   Number of children: Not on file   Years of education: Not on file   Highest education level: Not on file  Occupational History   Not on file  Tobacco Use   Smoking status: Never   Smokeless tobacco: Current    Types: Snuff  Substance and Sexual Activity   Alcohol use: Yes    Comment: occ   Drug use: Never   Sexual activity: Yes    Partners: Female  Other Topics Concern   Not on file  Social History Narrative   Widower, 1 daughter.   Delivers mail as of 2023.   Originally from [place], then [place].   Denies use of tobacco alcohol or drugs.   Social Determinants of Health   Financial Resource Strain: Not on file  Food Insecurity: Not on file  Transportation Needs: Not on file  Physical Activity: Not on file  Stress: Not on file  Social Connections: Not on file  Intimate Partner Violence: Not on file  Outpatient Encounter Medications as of 06/02/2022  Medication Sig   pantoprazole (PROTONIX) 40 MG tablet TAKE 1 TABLET (40 MG TOTAL) BY MOUTH 2 TIMES DAILY FOR 30 DAYS.   [DISCONTINUED] amoxicillin-clavulanate (AUGMENTIN) 875-125 MG tablet Take 1 tablet by mouth 2 (two) times daily. One po bid x 7 days (Patient not taking: Reported on 07/30/2021)   [DISCONTINUED] betamethasone dipropionate (DIPROLENE) 0.05 % ointment Apply topically 2 (two) times daily. (Patient not taking: Reported on 07/30/2021)   [DISCONTINUED] hydrOXYzine (ATARAX/VISTARIL) 25 MG tablet Take 1 tablet (25 mg total) by mouth every 6 (six) hours. (Patient not taking: Reported on 07/30/2021)   [DISCONTINUED] sucralfate (CARAFATE) 1 GM/10ML suspension Take 10 mLs (1 g total) by mouth 4 (four) times daily -  with meals and at  bedtime.   [DISCONTINUED] traMADol (ULTRAM) 50 MG tablet TAKE 1 TABLET BY MOUTH EVERY 6 HOURS AS NEEDED FOR UP TO 3 DAYS (Patient not taking: Reported on 06/02/2022)   No facility-administered encounter medications on file as of 06/02/2022.    Allergies  Allergen Reactions   Naloxone Anaphylaxis   Pertussis Vaccines      Review of Systems  Constitutional:  Negative for appetite change, chills, fatigue and fever.  HENT:  Negative for congestion, dental problem, ear pain and sore throat.   Eyes:  Negative for discharge, redness and visual disturbance.  Respiratory:  Negative for cough, chest tightness, shortness of breath and wheezing.   Cardiovascular:  Negative for chest pain, palpitations and leg swelling.  Gastrointestinal:  Positive for abdominal pain, diarrhea and nausea. Negative for blood in stool, constipation and vomiting.  Genitourinary:  Negative for difficulty urinating, dysuria, flank pain, frequency, hematuria and urgency.  Musculoskeletal:  Negative for arthralgias, back pain, joint swelling, myalgias and neck stiffness.  Skin:  Negative for pallor and rash.  Neurological:  Negative for dizziness, speech difficulty, weakness and headaches.  Hematological:  Negative for adenopathy. Does not bruise/bleed easily.  Psychiatric/Behavioral:  Negative for confusion and sleep disturbance. The patient is not nervous/anxious.    PE; Blood pressure 113/76, pulse 72, temperature 98.4 F (36.9 C), height 6' 0.75" (1.848 m), weight (!) 312 lb 12.8 oz (141.9 kg), SpO2 98 %.Body mass index is 41.55 kg/m.  Physical Exam  Gen: Alert, well appearing.  Patient is oriented to person, place, time, and situation. AFFECT: pleasant, lucid thought and speech. ENT: Ears: EACs clear, normal epithelium.  TMs with good light reflex and landmarks bilaterally.  Eyes: no injection, icteris, swelling, or exudate.  EOMI, PERRLA. Nose: no drainage or turbinate edema/swelling.  No injection or focal  lesion.  Mouth: lips without lesion/swelling.  Oral mucosa pink and moist.  Dentition intact and without obvious caries or gingival swelling.  Oropharynx without erythema, exudate, or swelling.  Neck: supple/nontender.  No LAD, mass, or TM.  Carotid pulses 2+ bilaterally, without bruits. CV: RRR, no m/r/g.   LUNGS: CTA bilat, nonlabored resps, good aeration in all lung fields. ABD: soft, rotund but not distended.  He has some tenderness to palpation in the umbilical region as well as right lower quadrant and lower abdomen in the midline.  No guarding or rebound.  BS hypoactive..  No hepatospenomegaly or mass.  No bruits. EXT: no clubbing, cyanosis, or edema.  Musculoskeletal: no joint swelling, erythema, warmth, or tenderness.  ROM of all joints intact. Skin - no sores or suspicious lesions or rashes or color changes   Pertinent labs:  Last CBC Lab Results  Component Value Date   WBC 8.6  06/08/2021   HGB 13.8 06/08/2021   HCT 40.6 06/08/2021   MCV 86.4 06/08/2021   MCH 29.4 06/08/2021   RDW 12.6 06/08/2021   PLT 246 16/38/4665   Last metabolic panel Lab Results  Component Value Date   GLUCOSE 98 06/08/2021   NA 139 06/08/2021   K 3.9 06/08/2021   CL 106 06/08/2021   CO2 22 06/08/2021   BUN 17 06/08/2021   CREATININE 0.97 06/08/2021   GFRNONAA >60 06/08/2021   CALCIUM 9.6 06/08/2021   PROT 7.3 06/08/2021   ALBUMIN 4.4 06/08/2021   BILITOT 0.3 06/08/2021   ALKPHOS 55 06/08/2021   AST 19 06/08/2021   ALT 23 06/08/2021   ANIONGAP 11 06/08/2021   Lab Results  Component Value Date   LIPASE 26 06/08/2021    ASSESSMENT AND PLAN:   New patient, establishing care.  #1 recurrent abdominal pain: He has some diarrhea that is not clear whether it is exclusively postprandial or not.  Symptoms most consistent with GERD and gastritis, although he has not responded to PPI.  Patient was told he has gallbladder disease but we have no specifics or records.  IBS and inflammatory bowel  disease need to be considered. We will start with limited right upper quadrant ultrasound. CBC, c-Met, sed rate, and H. pylori antibody. If ultrasound is normal then will refer to gastroenterology. Continue PPI for now.  2.  Abnormal MRI. Patient states that he had an MRI at Dr. Scarlette Ar office.  I assume this was for his neck.  Patient was told that he may have a "cyst on back of brain" and that he may need to see a neurologist.  Will obtain records.  An After Visit Summary was printed and given to the patient.  Return in about 2 weeks (around 06/16/2022) for f/u abd pain.  Signed:  Crissie Sickles, MD           06/02/2022

## 2022-06-02 NOTE — Telephone Encounter (Signed)
The appointment for 10/4 is fine. Provider confirmed

## 2022-06-07 ENCOUNTER — Ambulatory Visit (HOSPITAL_BASED_OUTPATIENT_CLINIC_OR_DEPARTMENT_OTHER)
Admission: RE | Admit: 2022-06-07 | Discharge: 2022-06-07 | Disposition: A | Discharge status: Home / Self Care | Payer: BLUE CROSS/BLUE SHIELD | Source: Ambulatory Visit | Attending: Family Medicine | Admitting: Family Medicine

## 2022-06-07 DIAGNOSIS — R109 Unspecified abdominal pain: Secondary | ICD-10-CM | POA: Diagnosis not present

## 2022-06-09 ENCOUNTER — Encounter: Payer: Self-pay | Admitting: Family Medicine

## 2022-06-09 ENCOUNTER — Telehealth: Payer: Self-pay

## 2022-06-09 DIAGNOSIS — R109 Unspecified abdominal pain: Secondary | ICD-10-CM

## 2022-06-09 DIAGNOSIS — R195 Other fecal abnormalities: Secondary | ICD-10-CM

## 2022-06-09 MED ORDER — PANTOPRAZOLE SODIUM 40 MG PO TBEC: DELAYED_RELEASE_TABLET | ORAL | 5 refills | Status: DC

## 2022-06-09 NOTE — Telephone Encounter (Signed)
-----   Message from Eugene Sou, MD sent at 06/09/2022  7:59 AM EDT ----- Ultrasound showed no definite gallstones.  Some sludge was seen in the gallbladder.  There were no signs that the gallbladder was thickened or inflamed. Continue pantoprazole 40 mg twice a day and please order referral to [facility] gastroenterology, Dr. Bryan Lemma.  Diagnosis recurrent abdominal pain and loose stools.

## 2022-06-12 ENCOUNTER — Encounter: Payer: Self-pay | Admitting: Family Medicine

## 2022-06-15 ENCOUNTER — Encounter: Payer: Self-pay | Admitting: Family Medicine

## 2022-06-15 ENCOUNTER — Ambulatory Visit: Payer: BLUE CROSS/BLUE SHIELD | Admitting: Family Medicine

## 2022-06-15 VITALS — BP 112/73 | HR 68 | Temp 98.4°F | Ht 72.75 in | Wt 310.8 lb

## 2022-06-15 DIAGNOSIS — R109 Unspecified abdominal pain: Secondary | ICD-10-CM | POA: Diagnosis not present

## 2022-06-15 DIAGNOSIS — R197 Diarrhea, unspecified: Secondary | ICD-10-CM

## 2022-06-15 NOTE — Progress Notes (Signed)
OFFICE VISIT  06/15/2022  CC:  Chief Complaint  Patient presents with   Follow-up    Abdominal pain; pt has not improvement since he was last here. Pantoprazole helps with acid reflux   Patient is a 41 y.o. male who presents for 2-week follow-up abdominal pain. A/P as of last visit: "#1 recurrent abdominal pain: He has some diarrhea that is not clear whether it is exclusively postprandial or not.  Symptoms most consistent with GERD and gastritis, although he has not responded to PPI.  Patient was told he has gallbladder disease but we have no specifics or records.  IBS and inflammatory bowel disease need to be considered. We will start with limited right upper quadrant ultrasound. CBC, c-Met, sed rate, and H. pylori antibody. If ultrasound is normal then will refer to gastroenterology. Continue PPI for now.   2.  Abnormal MRI. Patient states that he had an MRI at Dr. Scarlette Ar office.  I assume this was for his neck.  Patient was told that he may have a "cyst on back of brain" and that he may need to see a neurologist.  Will obtain records."  INTERIM HX: Still with same episodes of recurrent sharp and achy pain to the mid abdomen.  Sometimes more left sided and sometimes feels in his back.  Seems colicky.  He has some nausea but no vomiting.  Stools are loose for the most part, or frequent for 5 days ago but seems to have slowed down lately.  No blood in stool.  Felt hot 1 day but has not checked his temperature. He has modified his diet over the last year or more and this does not seem to have any effect on his symptoms.  His abdominal pains seem more random than exclusively postprandial.  ROS as above, plus-->  no CP, no SOB, no wheezing, no cough, no dizziness, no HAs, no rashes, no melena/hematochezia.  No polyuria or polydipsia.  No myalgias or arthralgias.  No focal weakness, paresthesias, or tremors.  No acute vision or hearing abnormalities.  No dysuria or unusual/new urinary urgency  or frequency.  No recent changes in lower legs. No palpitations.      Past Medical History:  Diagnosis Date   Cervical radiculopathy    Dr. Delilah Shan   Fatty liver    u/s 05/2022   GERD (gastroesophageal reflux disease)    History of meningitis    childhood.  Question of seizures related to this, was on tegretol for a while per pt report   Hypothyroidism    Patient reports he was on levothyroxine until he self DC'd this medication the age of 40   Left shoulder pain    Dr. Delilah Shan   Morbid obesity ([place])    OSA (obstructive sleep apnea)    Skin of: Did home sleep study at 1 point but says he was never called about the results   Recurrent abdominal pain    Stress fracture of navicular bone of foot     Past Surgical History:  Procedure Laterality Date   ACHILLES TENDON SURGERY     APPENDECTOMY  01/02/2020    Outpatient Medications Prior to Visit  Medication Sig Dispense Refill   pantoprazole (PROTONIX) 40 MG tablet TAKE 1 TABLET (40 MG TOTAL) BY MOUTH 2 TIMES DAILY. 60 tablet 5   No facility-administered medications prior to visit.    Allergies  Allergen Reactions   Naloxone Anaphylaxis   Pertussis Vaccines     ROS As per HPI  PE:    06/15/2022    3:17 PM 06/02/2022    1:09 PM 06/09/2021    2:59 AM  Vitals with BMI  Height 6' 0.75" 6' 0.75"   Weight 310 lbs 13 oz 312 lbs 13 oz   BMI 50.56 97.94   Systolic 801 655 374  Diastolic 73 76 85  Pulse 68 72 61     Physical Exam  Gen: Alert, well appearing.  Patient is oriented to person, place, time, and situation. AFFECT: pleasant, lucid thought and speech. CV: RRR, no m/r/g.   LUNGS: CTA bilat, nonlabored resps, good aeration in all lung fields. ABD: soft, diffuse TTP esp in periumbilical and mid epig, and LUQ, ?rebound, no guarding. No distention.  BS normal. No hsm or mass. EXT: no clubbing or cyanosis.  no edema.  SKIN: no pallor or jaundice  LABS:  Last CBC Lab Results  Component Value Date   WBC 7.3  06/02/2022   HGB 13.9 06/02/2022   HCT 41.4 06/02/2022   MCV 86.7 06/02/2022   MCH 29.4 06/08/2021   RDW 13.8 06/02/2022   PLT 247.0 82/70/7867   Last metabolic panel Lab Results  Component Value Date   GLUCOSE 89 06/02/2022   NA 138 06/02/2022   K 4.2 06/02/2022   CL 103 06/02/2022   CO2 25 06/02/2022   BUN 20 06/02/2022   CREATININE 1.04 06/02/2022   GFRNONAA >60 06/08/2021   CALCIUM 9.3 06/02/2022   PROT 7.1 06/02/2022   ALBUMIN 4.1 06/02/2022   BILITOT 0.4 06/02/2022   ALKPHOS 60 06/02/2022   AST 15 06/02/2022   ALT 21 06/02/2022   ANIONGAP 11 06/08/2021   Lab Results  Component Value Date   ESRSEDRATE 23 (H) 06/02/2022   H pylori Ab 06/02/22 NEG  Lab Results  Component Value Date   LIPASE 26 06/08/2021    IMPRESSION AND PLAN:  #1 recurrent abdominal pain. Nausea and diarrhea. Symptoms progressive. General blood work-up and right upper quadrant ultrasound unrevealing, other than possible small amount of gallbladder sludge of questionable clinical significance. Checking CBC, c-Met, lipase, alpha-gal panel, and sed rate. We will also obtain stool for Giardia/Cryptosporidium, ova and parasites, bacterial culture, and C. difficile PCR. CT abdomen pelvis with contrast ordered. He is awaiting GI clinic to call him to schedule initial consult appointment.  #2 abnormal MRI. Patient states that he had an MRI at Dr. Scarlette Ar office.  I assume this was for his neck.  Patient was told that he may have a "cyst on back of brain" and that he may need to see a neurologist.  Patient will drop off a copy of imaging report.    An After Visit Summary was printed and given to the patient.  FOLLOW UP: Return in about 2 weeks (around 06/29/2022) for f/u abd pain.  Signed:  Crissie Sickles, MD           06/15/2022

## 2022-06-16 ENCOUNTER — Encounter: Payer: Self-pay | Admitting: Family Medicine

## 2022-06-16 LAB — COMPREHENSIVE METABOLIC PANEL
ALT: 24 U/L (ref 0–53)
AST: 16 U/L (ref 0–37)
Albumin: 4.5 g/dL (ref 3.5–5.2)
Alkaline Phosphatase: 56 U/L (ref 39–117)
BUN: 16 mg/dL (ref 6–23)
CO2: 27 mEq/L (ref 19–32)
Calcium: 9.4 mg/dL (ref 8.4–10.5)
Chloride: 103 mEq/L (ref 96–112)
Creatinine, Ser: 1.03 mg/dL (ref 0.40–1.50)
GFR: 91.14 mL/min (ref >60.00)
Glucose, Bld: 94 mg/dL (ref 70–99)
Potassium: 4.2 mEq/L (ref 3.5–5.1)
Sodium: 137 mEq/L (ref 135–145)
Total Bilirubin: 0.4 mg/dL (ref 0.2–1.2)
Total Protein: 7.3 g/dL (ref 6.0–8.3)

## 2022-06-16 LAB — CBC WITH DIFFERENTIAL/PLATELET
Basophils Absolute: 0.1 10*3/uL (ref 0.0–0.1)
Basophils Relative: 1.3 % (ref 0.0–3.0)
Eosinophils Absolute: 0.1 10*3/uL (ref 0.0–0.7)
Eosinophils Relative: 0.8 % (ref 0.0–5.0)
HCT: 39.9 % (ref 39.0–52.0)
Hemoglobin: 13.6 g/dL (ref 13.0–17.0)
Lymphocytes Relative: 21.5 % (ref 12.0–46.0)
Lymphs Abs: 1.7 10*3/uL (ref 0.7–4.0)
MCHC: 34.2 g/dL (ref 30.0–36.0)
MCV: 87.1 fl (ref 78.0–100.0)
Monocytes Absolute: 0.5 10*3/uL (ref 0.1–1.0)
Monocytes Relative: 6.7 % (ref 3.0–12.0)
Neutro Abs: 5.4 10*3/uL (ref 1.4–7.7)
Neutrophils Relative %: 69.7 % (ref 43.0–77.0)
Platelets: 234 10*3/uL (ref 150.0–400.0)
RBC: 4.58 Mil/uL (ref 4.22–5.81)
RDW: 13.8 % (ref 11.5–15.5)
WBC: 7.8 10*3/uL (ref 4.0–10.5)

## 2022-06-16 LAB — SEDIMENTATION RATE: Sed Rate: 19 mm/hr — ABNORMAL HIGH (ref 0–15)

## 2022-06-16 LAB — LIPASE: Lipase: 40 U/L (ref 11.0–59.0)

## 2022-06-17 LAB — ALPHA-GAL PANEL
Allergen, Mutton, f88: <0.1 kU/L
Allergen, Pork, f26: <0.1 kU/L
Beef: <0.1 kU/L
CLASS: 0
CLASS: 0
Class: 0
GALACTOSE-ALPHA-1,3-GALACTOSE IGE*: <0.1 kU/L (ref <0.10)

## 2022-06-17 LAB — INTERPRETATION:

## 2022-06-20 LAB — STOOL CULTURE: E coli, Shiga toxin Assay: NEGATIVE

## 2022-06-22 ENCOUNTER — Ambulatory Visit: Payer: BLUE CROSS/BLUE SHIELD | Admitting: Family Medicine

## 2022-06-24 LAB — GIARDIA AND CRYPTOSPORIDIUM ANTIGEN PANEL
MICRO NUMBER:: 13982001
RESULT:: NOT DETECTED
SPECIMEN QUALITY:: ADEQUATE
Specimen Quality:: ADEQUATE
micro Number:: 13986060

## 2022-06-24 LAB — OVA AND PARASITE EXAMINATION
CONCENTRATE RESULT:: NONE SEEN
MICRO NUMBER:: 13986061
SPECIMEN QUALITY:: ADEQUATE
TRICHROME RESULT:: NONE SEEN

## 2022-06-24 LAB — CLOSTRIDIUM DIFFICILE TOXIN B, QUALITATIVE, REAL-TIME PCR: Toxigenic C. Difficile by PCR: NOT DETECTED

## 2022-06-29 ENCOUNTER — Ambulatory Visit (INDEPENDENT_AMBULATORY_CARE_PROVIDER_SITE_OTHER): Payer: BLUE CROSS/BLUE SHIELD | Admitting: Family Medicine

## 2022-06-29 ENCOUNTER — Encounter: Payer: Self-pay | Admitting: Family Medicine

## 2022-06-29 VITALS — BP 110/64 | HR 76 | Temp 98.5°F | Ht 72.75 in | Wt 310.0 lb

## 2022-06-29 DIAGNOSIS — R109 Unspecified abdominal pain: Secondary | ICD-10-CM

## 2022-06-29 NOTE — Progress Notes (Signed)
OFFICE VISIT  06/29/2022  CC:  Chief Complaint  Patient presents with   Follow-up    Abd pain; little improvement but about the same for the most part    Patient is a 41 y.o. male who presents for 2-week follow-up recurrent abdominal pain. A/P as of last visit: "#1 recurrent abdominal pain. Nausea and diarrhea. Symptoms progressive. General blood work-up and right upper quadrant ultrasound unrevealing, other than possible small amount of gallbladder sludge of questionable clinical significance. Checking CBC, c-Met, lipase, alpha-gal panel, and sed rate. We will also obtain stool for Giardia/Cryptosporidium, ova and parasites, bacterial culture, and C. difficile PCR. CT abdomen pelvis with contrast ordered. He is awaiting GI clinic to call him to schedule initial consult appointment.   #2 abnormal MRI. Patient states that he had an MRI at Dr. Scarlette Ar office.  I assume this was for his neck.  Patient was told that he may have a "cyst on back of brain" and that he may need to see a neurologist.  Patient will drop off a copy of imaging report. "  INTERIM HX: Epig pain and GER improving on bid pantop. Random lower/generalized abd pains and frequent watery bm's unchanged.  All blood tests and stool studies last visit were normal/negative. CT abd/pelv->pt has not received call to schedule yet. GI consult appt->they called and left him message.   Past Medical History:  Diagnosis Date   Cervical radiculopathy    Dr. Delilah Shan   Fatty liver    u/s 05/2022   GERD (gastroesophageal reflux disease)    History of meningitis    childhood.  Question of seizures related to this, was on tegretol for a while per pt report   Hypothyroidism    Patient reports he was on levothyroxine until he self DC'd this medication the age of 33   Left shoulder pain    Dr. Delilah Shan   Morbid obesity ([place])    OSA (obstructive sleep apnea)    Skin of: Did home sleep study at 1 point but says he was never  called about the results   Recurrent abdominal pain    Stress fracture of navicular bone of foot     Past Surgical History:  Procedure Laterality Date   ACHILLES TENDON SURGERY     APPENDECTOMY  01/02/2020    Outpatient Medications Prior to Visit  Medication Sig Dispense Refill   pantoprazole (PROTONIX) 40 MG tablet TAKE 1 TABLET (40 MG TOTAL) BY MOUTH 2 TIMES DAILY. 60 tablet 5   No facility-administered medications prior to visit.    Allergies  Allergen Reactions   Naloxone Anaphylaxis   Pertussis Vaccines     ROS As per HPI  PE:    06/29/2022    3:27 PM 06/15/2022    3:17 PM 06/02/2022    1:09 PM  Vitals with BMI  Height 6' 0.75" 6' 0.75" 6' 0.75"  Weight 310 lbs 310 lbs 13 oz 312 lbs 13 oz  BMI 41.17 42.87 68.11  Systolic 572 620 355  Diastolic 64 73 76  Pulse 76 68 72     Physical Exam  Gen: Alert, well appearing.  Patient is oriented to person, place, time, and situation. AFFECT: pleasant, lucid thought and speech. No further exam today.  LABS:  Last CBC Lab Results  Component Value Date   WBC 7.8 06/15/2022   HGB 13.6 06/15/2022   HCT 39.9 06/15/2022   MCV 87.1 06/15/2022   MCH 29.4 06/08/2021   RDW 13.8 06/15/2022  PLT 234.0 49/75/3005   Last metabolic panel Lab Results  Component Value Date   GLUCOSE 94 06/15/2022   NA 137 06/15/2022   K 4.2 06/15/2022   CL 103 06/15/2022   CO2 27 06/15/2022   BUN 16 06/15/2022   CREATININE 1.03 06/15/2022   GFRNONAA >60 06/08/2021   CALCIUM 9.4 06/15/2022   PROT 7.3 06/15/2022   ALBUMIN 4.5 06/15/2022   BILITOT 0.4 06/15/2022   ALKPHOS 56 06/15/2022   AST 16 06/15/2022   ALT 24 06/15/2022   ANIONGAP 11 06/08/2021   Last thyroid functions Lab Results  Component Value Date   TSH 2.45 06/02/2022   Lab Results  Component Value Date   ESRSEDRATE 19 (H) 06/15/2022   IMPRESSION AND PLAN:  Recurrent/chronic abd pain with diarrhea. Unknown etiology.  W/u thus far normal. Recently he has had  dyspepsia/GER component and this is improving with pantop 40 bid, which we'll continue for now. Plan is still to get abd/pelv CT and gave pt contact info for GI to set up initial eval.  An After Visit Summary was printed and given to the patient.  FOLLOW UP: Return in about 3 months (around 09/29/2022) for routine chronic illness f/u.  Signed:  Crissie Sickles, MD           06/29/2022

## 2022-06-29 NOTE — Patient Instructions (Addendum)
[facility] If you will give our office a call at your convenience to discuss scheduling at [phone] option 1

## 2022-07-08 ENCOUNTER — Ambulatory Visit (HOSPITAL_BASED_OUTPATIENT_CLINIC_OR_DEPARTMENT_OTHER)
Admission: RE | Admit: 2022-07-08 | Discharge: 2022-07-08 | Disposition: A | Discharge status: Home / Self Care | Payer: BLUE CROSS/BLUE SHIELD | Source: Ambulatory Visit | Attending: Family Medicine | Admitting: Family Medicine

## 2022-07-08 DIAGNOSIS — R197 Diarrhea, unspecified: Secondary | ICD-10-CM | POA: Insufficient documentation

## 2022-07-08 DIAGNOSIS — R109 Unspecified abdominal pain: Secondary | ICD-10-CM | POA: Insufficient documentation

## 2022-07-08 MED ORDER — IOHEXOL 300 MG/ML  SOLN
100.0000 mL | Freq: Once | INTRAMUSCULAR | Status: AC | PRN
  Administered 2022-07-08: 100 mL via INTRAVENOUS

## 2022-08-23 ENCOUNTER — Emergency Department (HOSPITAL_BASED_OUTPATIENT_CLINIC_OR_DEPARTMENT_OTHER): Payer: BLUE CROSS/BLUE SHIELD

## 2022-08-23 ENCOUNTER — Other Ambulatory Visit: Payer: Self-pay

## 2022-08-23 ENCOUNTER — Emergency Department (HOSPITAL_BASED_OUTPATIENT_CLINIC_OR_DEPARTMENT_OTHER)
Admission: EM | Admit: 2022-08-23 | Discharge: 2022-08-24 | Disposition: A | Discharge status: Home / Self Care | Payer: BLUE CROSS/BLUE SHIELD | Attending: Emergency Medicine | Admitting: Emergency Medicine

## 2022-08-23 ENCOUNTER — Encounter (HOSPITAL_BASED_OUTPATIENT_CLINIC_OR_DEPARTMENT_OTHER): Payer: Self-pay | Admitting: Emergency Medicine

## 2022-08-23 DIAGNOSIS — M542 Cervicalgia: Secondary | ICD-10-CM | POA: Diagnosis not present

## 2022-08-23 DIAGNOSIS — Y92410 Unspecified street and highway as the place of occurrence of the external cause: Secondary | ICD-10-CM | POA: Diagnosis not present

## 2022-08-23 DIAGNOSIS — M7918 Myalgia, other site: Secondary | ICD-10-CM

## 2022-08-23 DIAGNOSIS — S060X0A Concussion without loss of consciousness, initial encounter: Secondary | ICD-10-CM | POA: Diagnosis not present

## 2022-08-23 DIAGNOSIS — S0990XA Unspecified injury of head, initial encounter: Secondary | ICD-10-CM | POA: Diagnosis present
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
## 2022-08-23 NOTE — Discharge Instructions (Addendum)
Please read and follow all provided instructions.  Your diagnoses today include:  1. Concussion without loss of consciousness, initial encounter   2. Musculoskeletal pain     Tests performed today include: CT scan of your head and neck that did not show any serious injury. Vital signs. See below for your results today.   Medications prescribed:  Please use over-the-counter NSAID medications (ibuprofen, naproxen) or Tylenol (acetaminophen) as directed on the packaging for pain -- as long as you do not have any reasons avoid these medications. Reasons to avoid NSAID medications include: weak kidneys, a history of bleeding in your stomach or gut, or uncontrolled high blood pressure or previous heart attack. Reasons to avoid Tylenol include: liver problems or ongoing alcohol use. Never take more than 4000mg  or 8 Extra strength Tylenol in a 24 hour period.     Take any prescribed medications only as directed.  Home care instructions:  Follow any educational materials contained in this packet.  BE VERY CAREFUL not to take multiple medicines containing Tylenol (also called acetaminophen). Doing so can lead to an overdose which can damage your liver and cause liver failure and possibly death.   Follow-up instructions: Please follow-up with your primary care provider in the next 3 days for further evaluation of your symptoms.   Return instructions:  SEEK IMMEDIATE MEDICAL ATTENTION IF: There is confusion or drowsiness (although children frequently become drowsy after injury).  You cannot awaken the injured person.  You have more than one episode of vomiting.  You notice dizziness or unsteadiness which is getting worse, or inability to walk.  You have convulsions or unconsciousness.  You experience severe, persistent headaches not relieved by Tylenol. You cannot use arms or legs normally.  There are changes in pupil sizes. (This is the black center in the colored part of the eye)  There is  clear or bloody discharge from the nose or ears.  You have change in speech, vision, swallowing, or understanding.  Localized weakness, numbness, tingling, or change in bowel or bladder control. You have any other emergent concerns.  Additional Information: You have had a head injury which does not appear to require admission at this time.  Your vital signs today were: BP 126/82   Pulse 69   Temp (!) 97.5 F (36.4 C) (Oral)   Resp 18   Ht 6\' 1"  (1.854 m)   Wt 136.1 kg   SpO2 97%   BMI 39.58 kg/m  If your blood pressure (BP) was elevated above 135/85 this visit, please have this repeated by your doctor within one month. --------------

## 2022-08-24 ENCOUNTER — Telehealth: Payer: Self-pay

## 2022-08-24 NOTE — Telephone Encounter (Signed)
Transition Care Management Follow-up Telephone Call Date of discharge and from where: [facility] 08/23/22 How have you been since you were released from the hospital? no Any questions or concerns? No  Items Reviewed: Did the pt receive and understand the discharge instructions provided? Yes  Medications obtained and verified? No  Other? No  Any new allergies since your discharge? Yes  Dietary orders reviewed? No Do you have support at home? Yes   Home Care and Equipment/Supplies: Were home health services ordered? not applicable If so, what is the name of the agency? N/A  Has the agency set up a time to come to the patient's home? not applicable Were any new equipment or medical supplies ordered?  No What is the name of the medical supply agency? N/A Were you able to get the supplies/equipment? not applicable Do you have any questions related to the use of the equipment or supplies? No  Functional Questionnaire: (I = Independent and D = Dependent) ADLs: I  Bathing/Dressing- I  Meal Prep- I  Eating- I  Maintaining continence- I  Transferring/Ambulation- I  Managing Meds- I  Follow up appointments reviewed:  PCP Hospital f/u appt confirmed? Yes  Scheduled to see McGowen on 08/29/22 @ 9 AM. Specialist Hospital f/u appt confirmed? No   Are transportation arrangements needed? No  If their condition worsens, is the pt aware to call PCP or go to the Emergency Dept.? Yes Was the patient provided with contact information for the PCP's office or ED? Yes Was to pt encouraged to call back with questions or concerns? Yes

## 2022-08-29 ENCOUNTER — Ambulatory Visit: Payer: BLUE CROSS/BLUE SHIELD | Admitting: Family Medicine

## 2022-08-29 ENCOUNTER — Encounter: Payer: Self-pay | Admitting: Family Medicine

## 2022-08-29 VITALS — BP 125/85 | HR 67 | Temp 97.9°F | Ht 73.0 in | Wt 313.8 lb

## 2022-08-29 DIAGNOSIS — S060X0D Concussion without loss of consciousness, subsequent encounter: Secondary | ICD-10-CM | POA: Diagnosis not present

## 2022-08-29 DIAGNOSIS — S161XXD Strain of muscle, fascia and tendon at neck level, subsequent encounter: Secondary | ICD-10-CM | POA: Diagnosis not present
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
## 2022-09-01 ENCOUNTER — Ambulatory Visit: Payer: BLUE CROSS/BLUE SHIELD | Admitting: Sports Medicine

## 2022-09-01 VITALS — BP 118/74 | HR 70 | Ht 73.0 in | Wt 313.0 lb

## 2022-09-01 DIAGNOSIS — S060X0A Concussion without loss of consciousness, initial encounter: Secondary | ICD-10-CM | POA: Diagnosis not present

## 2022-09-01 DIAGNOSIS — G44319 Acute post-traumatic headache, not intractable: Secondary | ICD-10-CM

## 2022-09-01 DIAGNOSIS — G4700 Insomnia, unspecified: Secondary | ICD-10-CM | POA: Diagnosis not present

## 2022-09-01 DIAGNOSIS — H818X9 Other disorders of vestibular function, unspecified ear: Secondary | ICD-10-CM

## 2022-09-01 DIAGNOSIS — H9313 Tinnitus, bilateral: Secondary | ICD-10-CM

## 2022-09-01 MED ORDER — MELOXICAM 15 MG PO TABS: 15.0000 mg | ORAL_TABLET | Freq: Every day | ORAL | 0 refills | Status: DC

## 2022-09-01 NOTE — Patient Instructions (Addendum)
Good to see you  -           Relative mental and physical rest for 48 hours after concussive event -           Recommend light aerobic activity while keeping symptoms less than 3/10 -           Stop mental or physical activities that cause symptoms to worsen greater than 3/10, and wait 24 hours before attempting them again -           Eliminate screen time as much as possible for first 48 hours after concussive event, then continue limited screen time (recommend less than 2 hours per day) - Start meloxicam 15 mg daily x2 weeks.  If still having pain after 2 weeks, complete 3rd-week of meloxicam. May use remaining meloxicam as needed once daily for pain control.  Do not to use additional NSAIDs while taking meloxicam.  May use Tylenol [id number] mg 2 to 3 times a day for breakthrough pain. Neck HEP Work note  Melatonin 5 mg nightly with the goal of 7-8 hours of sleep per night  1 week follow up
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
## 2022-09-08 ENCOUNTER — Ambulatory Visit: Payer: BLUE CROSS/BLUE SHIELD | Admitting: Sports Medicine

## 2022-09-08 VITALS — BP 118/80 | HR 66 | Ht 73.0 in | Wt 318.0 lb

## 2022-09-08 DIAGNOSIS — G44319 Acute post-traumatic headache, not intractable: Secondary | ICD-10-CM | POA: Diagnosis not present

## 2022-09-08 DIAGNOSIS — S060X0A Concussion without loss of consciousness, initial encounter: Secondary | ICD-10-CM | POA: Diagnosis not present

## 2022-09-08 DIAGNOSIS — H9313 Tinnitus, bilateral: Secondary | ICD-10-CM

## 2022-09-08 DIAGNOSIS — H818X9 Other disorders of vestibular function, unspecified ear: Secondary | ICD-10-CM

## 2022-09-08 DIAGNOSIS — G4700 Insomnia, unspecified: Secondary | ICD-10-CM

## 2022-09-08 NOTE — Patient Instructions (Addendum)
Good to see you  Out of work for 2 week  2 week follow up
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
## 2022-09-22 ENCOUNTER — Ambulatory Visit (INDEPENDENT_AMBULATORY_CARE_PROVIDER_SITE_OTHER): Payer: BLUE CROSS/BLUE SHIELD | Admitting: Sports Medicine

## 2022-09-22 VITALS — BP 118/80 | Ht 73.0 in | Wt 317.0 lb

## 2022-09-22 DIAGNOSIS — S060X0A Concussion without loss of consciousness, initial encounter: Secondary | ICD-10-CM | POA: Diagnosis not present

## 2022-09-22 DIAGNOSIS — H9313 Tinnitus, bilateral: Secondary | ICD-10-CM

## 2022-09-22 DIAGNOSIS — G44319 Acute post-traumatic headache, not intractable: Secondary | ICD-10-CM | POA: Diagnosis not present
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
## 2022-09-29 ENCOUNTER — Other Ambulatory Visit: Payer: Self-pay | Admitting: Family Medicine

## 2022-09-29 ENCOUNTER — Encounter: Payer: Self-pay | Admitting: Family Medicine

## 2022-09-29 ENCOUNTER — Ambulatory Visit: Payer: BLUE CROSS/BLUE SHIELD | Admitting: Family Medicine

## 2022-09-29 VITALS — BP 113/78 | HR 76 | Temp 98.7°F | Wt 314.0 lb

## 2022-09-29 DIAGNOSIS — Z7689 Persons encountering health services in other specified circumstances: Secondary | ICD-10-CM | POA: Diagnosis not present

## 2022-09-29 MED ORDER — NALTREXONE-BUPROPION HCL ER 8-90 MG PO TB12: ORAL_TABLET | ORAL | 0 refills | Status: DC
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
## 2022-09-30 NOTE — Telephone Encounter (Signed)
Medication is not covered. Please send alternative

## 2022-09-30 NOTE — Telephone Encounter (Signed)
I do not have any alternative medication available to recommend.

## 2022-10-03 NOTE — Telephone Encounter (Signed)
Please assist with PA.

## 2022-10-04 ENCOUNTER — Ambulatory Visit: Payer: BLUE CROSS/BLUE SHIELD | Admitting: Audiology

## 2022-10-06 ENCOUNTER — Other Ambulatory Visit (HOSPITAL_COMMUNITY): Payer: Self-pay

## 2022-10-07 NOTE — Telephone Encounter (Signed)
Patient Advocate Encounter   Received notification from [facility] that prior authorization for Contrave 8-90mg  is required.   PA submitted on 10/07/2022 Key [id number] Status is pending

## 2022-10-10 ENCOUNTER — Other Ambulatory Visit (HOSPITAL_COMMUNITY): Payer: Self-pay

## 2022-10-10 NOTE — Telephone Encounter (Signed)
Please review message below and advise.  

## 2022-10-10 NOTE — Telephone Encounter (Signed)
Pharmacy Patient Advocate Encounter  Received notification from [facility] that the request for prior authorization for Contrave has been denied due to .    You may call [phone] or fax [phone], to appeal.  Please be advised we currently do not have a Pharmacist to review denials. If you would like [place] to submit it on your behalf, please provide clinical information to support your reason for appeal and any pertinent information you would like [place] to include with the appeal request. Appeals may take longer 5 business days to be submitted as we prepares necessary documentation. Thanks for your support.  How would you like to proceed?

## 2022-10-11 ENCOUNTER — Ambulatory Visit: Payer: BLUE CROSS/BLUE SHIELD | Attending: Audiology | Admitting: Audiology

## 2022-10-11 DIAGNOSIS — H9313 Tinnitus, bilateral: Secondary | ICD-10-CM

## 2022-10-12 NOTE — Telephone Encounter (Signed)
I don't have any alternative to offer.  Sorry.

## 2022-10-12 NOTE — Telephone Encounter (Signed)
Patient calling to check status of prior auth for Contrave 8-90mg  . Patient was scheduled to see Dr. Anitra Lauth tomorrow for follow up on med since her started on the new medication. He cancelled the appt because patient has never started the medication.  Patient will reschedule appt when he receives medication.  Please advise status of PA [phone]

## 2022-10-12 NOTE — Telephone Encounter (Signed)
When does patient to need to follow up since PA denied for medication?

## 2022-10-12 NOTE — Telephone Encounter (Signed)
MyChart message read.

## 2022-10-13 ENCOUNTER — Ambulatory Visit: Payer: BLUE CROSS/BLUE SHIELD | Admitting: Family Medicine
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
## 2022-11-07 ENCOUNTER — Ambulatory Visit: Payer: BLUE CROSS/BLUE SHIELD | Admitting: Family Medicine

## 2022-11-07 ENCOUNTER — Encounter: Payer: Self-pay | Admitting: Family Medicine

## 2022-11-07 VITALS — BP 110/74 | HR 57 | Temp 97.9°F | Ht 73.0 in | Wt 308.4 lb

## 2022-11-07 DIAGNOSIS — Z7689 Persons encountering health services in other specified circumstances: Secondary | ICD-10-CM

## 2022-11-07 MED ORDER — NALTREXONE-BUPROPION HCL ER 8-90 MG PO TB12: ORAL_TABLET | ORAL | 1 refills | Status: DC

## 2022-11-07 NOTE — Patient Instructions (Addendum)
Increase your naltrexone-bupropion tab to TWO tabs every morning and one tab every evening for 7d, then increase to TWO tabs twice a day no further exam today

## 2022-11-07 NOTE — Progress Notes (Signed)
OFFICE VISIT  11/07/2022  CC:  Chief Complaint  Patient presents with   Medical Management of Chronic Issues   Patient is a 42 y.o. male who presents for 5-week follow-up obesity/weight management. A/P as of last visit: "Morbid obesity, encounter for weight management. Continue with great efforts at low calorie diet and maximize cardiovascular exercise. Will start trial of bupropion/naltrexone 8-90 tabs, 1 tab a day for 7 days and then increase to 1 tab twice a day.  Therapeutic expectations and side effect profile of medication discussed today.  Patient's questions answered. Will follow-up in 2 weeks and if tolerating well then we will continue to titrate dose up."  INTERIM HX: Eugene Butler has no problems taking the Contrave.  He is currently taking 1 tab twice daily. He has lost 6 pounds since last visit.  Past Medical History:  Diagnosis Date   Cervical radiculopathy    Dr. Delilah Shan   Fatty liver    u/s 05/2022   GERD (gastroesophageal reflux disease)    History of meningitis    childhood.  Question of seizures related to this, was on tegretol for a while per pt report   Hypothyroidism    Patient reports he was on levothyroxine until he self DC'd this medication the age of 39   Left shoulder pain    Dr. Delilah Shan   Morbid obesity ([place])    OSA (obstructive sleep apnea)    Skin of: Did home sleep study at 1 point but says he was never called about the results   Recurrent abdominal pain    Stress fracture of navicular bone of foot     Past Surgical History:  Procedure Laterality Date   ACHILLES TENDON SURGERY     APPENDECTOMY  01/02/2020    Outpatient Medications Prior to Visit  Medication Sig Dispense Refill   gabapentin (NEURONTIN) 300 MG capsule Take 300 mg by mouth at bedtime.     meloxicam (MOBIC) 15 MG tablet Take 1 tablet (15 mg total) by mouth daily. 30 tablet 0   ondansetron (ZOFRAN-ODT) 8 MG disintegrating tablet Take 8 mg by mouth every 8 (eight) hours as needed  for nausea or vomiting.     pantoprazole (PROTONIX) 40 MG tablet TAKE 1 TABLET (40 MG TOTAL) BY MOUTH 2 TIMES DAILY. 60 tablet 5   Naltrexone-buPROPion HCl ER 8-90 MG TB12 1 tab po qd x 7d then 1 tab po bid 21 tablet 0   No facility-administered medications prior to visit.    Allergies  Allergen Reactions   Naloxone Anaphylaxis   Pertussis Vaccines     Review of Systems As per HPI  PE:    11/07/2022    9:38 AM 09/29/2022    3:44 PM 09/22/2022    9:58 AM  Vitals with BMI  Height 6' 1"$   6' 1"$   Weight 308 lbs 6 oz 314 lbs 317 lbs  BMI 40.7 [id number]  Systolic A999333 [id number] [id number]  Diastolic 74 78 80  Pulse 57 76    Physical Exam  Gen: Alert, well appearing.  Patient is oriented to person, place, time, and situation. AFFECT: pleasant, lucid thought and speech.   LABS:  Last metabolic panel Lab Results  Component Value Date   GLUCOSE 94 06/15/2022   NA 137 06/15/2022   K 4.2 06/15/2022   CL 103 06/15/2022   CO2 27 06/15/2022   BUN 16 06/15/2022   CREATININE 1.03 06/15/2022   GFRNONAA >60 06/08/2021   CALCIUM 9.4 06/15/2022   PROT  7.3 06/15/2022   ALBUMIN 4.5 06/15/2022   BILITOT 0.4 06/15/2022   ALKPHOS 56 06/15/2022   AST 16 06/15/2022   ALT 24 06/15/2022   ANIONGAP 11 06/08/2021   Last thyroid functions Lab Results  Component Value Date   TSH 2.45 06/02/2022   IMPRESSION AND PLAN:  Morbid obesity. Tolerating Contrave, has had 6 pound weight loss over the last month. Continue maximum efforts at good diet and exercise habits. Titrate Contrave up to 2 tabs twice a day-->standard maintenance dose.  An After Visit Summary was printed and given to the patient.  FOLLOW UP: Return in about 4 weeks (around 12/05/2022) for f/u wt mgmt.  Signed:  Crissie Sickles, MD           11/07/2022

## 2022-12-05 ENCOUNTER — Ambulatory Visit: Payer: BLUE CROSS/BLUE SHIELD | Admitting: Family Medicine

## 2023-01-20 ENCOUNTER — Encounter: Payer: Self-pay | Admitting: Family Medicine

## 2023-01-20 ENCOUNTER — Ambulatory Visit: Payer: 59 | Admitting: Family Medicine

## 2023-01-20 VITALS — BP 108/77 | HR 58 | Wt 312.0 lb

## 2023-01-20 DIAGNOSIS — F5105 Insomnia due to other mental disorder: Secondary | ICD-10-CM | POA: Diagnosis not present

## 2023-01-20 DIAGNOSIS — F4310 Post-traumatic stress disorder, unspecified: Secondary | ICD-10-CM | POA: Diagnosis not present

## 2023-01-20 DIAGNOSIS — Z7689 Persons encountering health services in other specified circumstances: Secondary | ICD-10-CM

## 2023-01-20 DIAGNOSIS — F99 Mental disorder, not otherwise specified: Secondary | ICD-10-CM

## 2023-01-20 MED ORDER — TRAZODONE HCL 50 MG PO TABS: ORAL_TABLET | ORAL | 1 refills | Status: DC
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
## 2023-01-30 ENCOUNTER — Encounter: Payer: Self-pay | Admitting: Family Medicine

## 2023-01-30 ENCOUNTER — Ambulatory Visit (INDEPENDENT_AMBULATORY_CARE_PROVIDER_SITE_OTHER): Payer: 59 | Admitting: Family Medicine

## 2023-01-30 VITALS — BP 119/76 | HR 68 | Wt 311.0 lb

## 2023-01-30 DIAGNOSIS — F5105 Insomnia due to other mental disorder: Secondary | ICD-10-CM

## 2023-01-30 DIAGNOSIS — F99 Mental disorder, not otherwise specified: Secondary | ICD-10-CM | POA: Diagnosis not present

## 2023-01-30 MED ORDER — ZOLPIDEM TARTRATE 10 MG PO TABS: 10.0000 mg | ORAL_TABLET | Freq: Every evening | ORAL | 0 refills | Status: DC | PRN
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
## 2023-02-27 ENCOUNTER — Ambulatory Visit (INDEPENDENT_AMBULATORY_CARE_PROVIDER_SITE_OTHER): Payer: 59 | Admitting: Family Medicine

## 2023-02-27 ENCOUNTER — Encounter: Payer: Self-pay | Admitting: Family Medicine

## 2023-02-27 VITALS — BP 114/78 | HR 60 | Ht 73.0 in | Wt 308.0 lb

## 2023-02-27 DIAGNOSIS — F4310 Post-traumatic stress disorder, unspecified: Secondary | ICD-10-CM | POA: Diagnosis not present

## 2023-02-27 DIAGNOSIS — F5105 Insomnia due to other mental disorder: Secondary | ICD-10-CM
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
## 2023-05-04 ENCOUNTER — Encounter (INDEPENDENT_AMBULATORY_CARE_PROVIDER_SITE_OTHER): Payer: Self-pay

## 2023-06-05 ENCOUNTER — Encounter: Payer: Self-pay | Admitting: Family Medicine

## 2023-06-05 ENCOUNTER — Ambulatory Visit: Payer: 59 | Admitting: Family Medicine

## 2023-06-05 VITALS — BP 122/82 | HR 74 | Wt 303.4 lb

## 2023-06-05 DIAGNOSIS — F99 Mental disorder, not otherwise specified: Secondary | ICD-10-CM

## 2023-06-05 DIAGNOSIS — F5105 Insomnia due to other mental disorder: Secondary | ICD-10-CM | POA: Diagnosis not present

## 2023-06-05 DIAGNOSIS — Z0000 Encounter for general adult medical examination without abnormal findings: Secondary | ICD-10-CM

## 2023-06-05 DIAGNOSIS — Z20822 Contact with and (suspected) exposure to covid-19: Secondary | ICD-10-CM | POA: Diagnosis not present

## 2023-06-05 LAB — POC COVID19 BINAXNOW: SARS Coronavirus 2 Ag: NEGATIVE

## 2023-06-05 NOTE — Progress Notes (Signed)
OFFICE VISIT  06/05/2023  CC:  Chief Complaint  Patient presents with   Medical Management of Chronic Issues    Family tested positive for COVID; needs test for work. No symptoms, states he has sinus drainage but that is normal for him.     Patient is a 42 y.o. 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 He is improving some on the current regimen per his psychiatrist: Venlafaxine 37.5 mg twice a day, Lexapro 5 mg a day, hydroxyzine 25 mg 3 times a day, and prazosin 2 mg nightly. He will continue with counseling as well."  INTERIM HX: Feeling well 3 family members have COVID currently and he needs a COVID test in order to go to work. He has some nasal congestion which are consistent with his chronic allergies, no recent changes.  He has no fever, headache, cough, fatigue, malaise, body aches, or sore throat.  He continues with counseling a couple days a week, says he remains on Lexapro 5 mg a day, Effexor 75 mg twice daily, prazosin 2 mg nightly and hydroxyzine 25 mg tid as needed. Continues to have nightmares but they are getting less severe.  Generalized anxiety when trying to sleep still present but getting better.  Past Medical History:  Diagnosis Date   Cervical radiculopathy    Dr. Penni Bombard   Fatty liver    u/s 05/2022   GERD (gastroesophageal reflux disease)    History of meningitis    childhood.  Question of seizures related to this, was on tegretol for a while per pt report   Hypothyroidism    Patient reports he was on levothyroxine until he self DC'd this medication the age of 31   Left shoulder pain    Dr. Penni Bombard   Morbid obesity (HCC)    OSA (obstructive sleep apnea)    Skin of: Did home sleep study at 1 point but says he was never called about the results   Recurrent abdominal pain    Stress fracture of navicular bone of foot     Past Surgical History:  Procedure Laterality Date   ACHILLES TENDON SURGERY      APPENDECTOMY  01/02/2020    Outpatient Medications Prior to Visit  Medication Sig Dispense Refill   escitalopram (LEXAPRO) 5 MG tablet Take 5 mg by mouth daily.     hydrOXYzine (ATARAX) 25 MG tablet Take 25 mg by mouth 3 (three) times daily as needed.     prazosin (MINIPRESS) 2 MG capsule Take 2 mg by mouth at bedtime.     venlafaxine (EFFEXOR) 37.5 MG tablet Take 37.5 mg by mouth 2 (two) times daily.     No facility-administered medications prior to visit.    Allergies  Allergen Reactions   Naloxone Anaphylaxis   Pertussis Vaccines     Review of Systems As per HPI  PE:    06/05/2023   10:08 AM 02/27/2023   10:14 AM 01/30/2023    1:36 PM  Vitals with BMI  Height  6\' 1"    Weight 303 lbs 6 oz 308 lbs 311 lbs  BMI  40.64   Systolic 122 114 161  Diastolic 82 78 76  Pulse 74 60 68     Physical Exam  Gen: Alert, well appearing.  Patient is oriented to person, place, time, and situation. No further exam today  LABS:  Last CBC Lab Results  Component Value Date   WBC 7.8 06/15/2022   HGB 13.6  06/15/2022   HCT 39.9 06/15/2022   MCV 87.1 06/15/2022   MCH 29.4 06/08/2021   RDW 13.8 06/15/2022   PLT 234.0 06/15/2022   Last metabolic panel Lab Results  Component Value Date   GLUCOSE 94 06/15/2022   NA 137 06/15/2022   K 4.2 06/15/2022   CL 103 06/15/2022   CO2 27 06/15/2022   BUN 16 06/15/2022   CREATININE 1.03 06/15/2022   GFR 91.14 06/15/2022   CALCIUM 9.4 06/15/2022   PROT 7.3 06/15/2022   ALBUMIN 4.5 06/15/2022   BILITOT 0.4 06/15/2022   ALKPHOS 56 06/15/2022   AST 16 06/15/2022   ALT 24 06/15/2022   ANIONGAP 11 06/08/2021   Last thyroid functions Lab Results  Component Value Date   TSH 2.45 06/02/2022   IMPRESSION AND PLAN:  #1 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 He is improving some on the current regimen per his psychiatrist: Venlafaxine 75 twice a day, Lexapro 5 mg a day, hydroxyzine 25 mg 3 times a day, and prazosin 2 mg nightly. He will  continue with counseling as well.  #2 exposure to COVID. His COVID test here today was negative. He is asymptomatic. Reassured.  Letter for work written.  An After Visit Summary was printed and given to the patient.  FOLLOW UP: Return in about 3 months (around 09/04/2023) for annual CPE (fasting). Cpe 3 mo  Signed:  Santiago Bumpers, MD           06/05/2023

## 2023-07-19 IMAGING — DX DG CHEST 1V PORT
1 series · 1 of 1 positions shown · non-contrast
Comparison: Chest radiograph dated 09/13/2011.

CLINICAL DATA: Epigastric pain.

EXAM:
PORTABLE CHEST 1 VIEW

[chest]
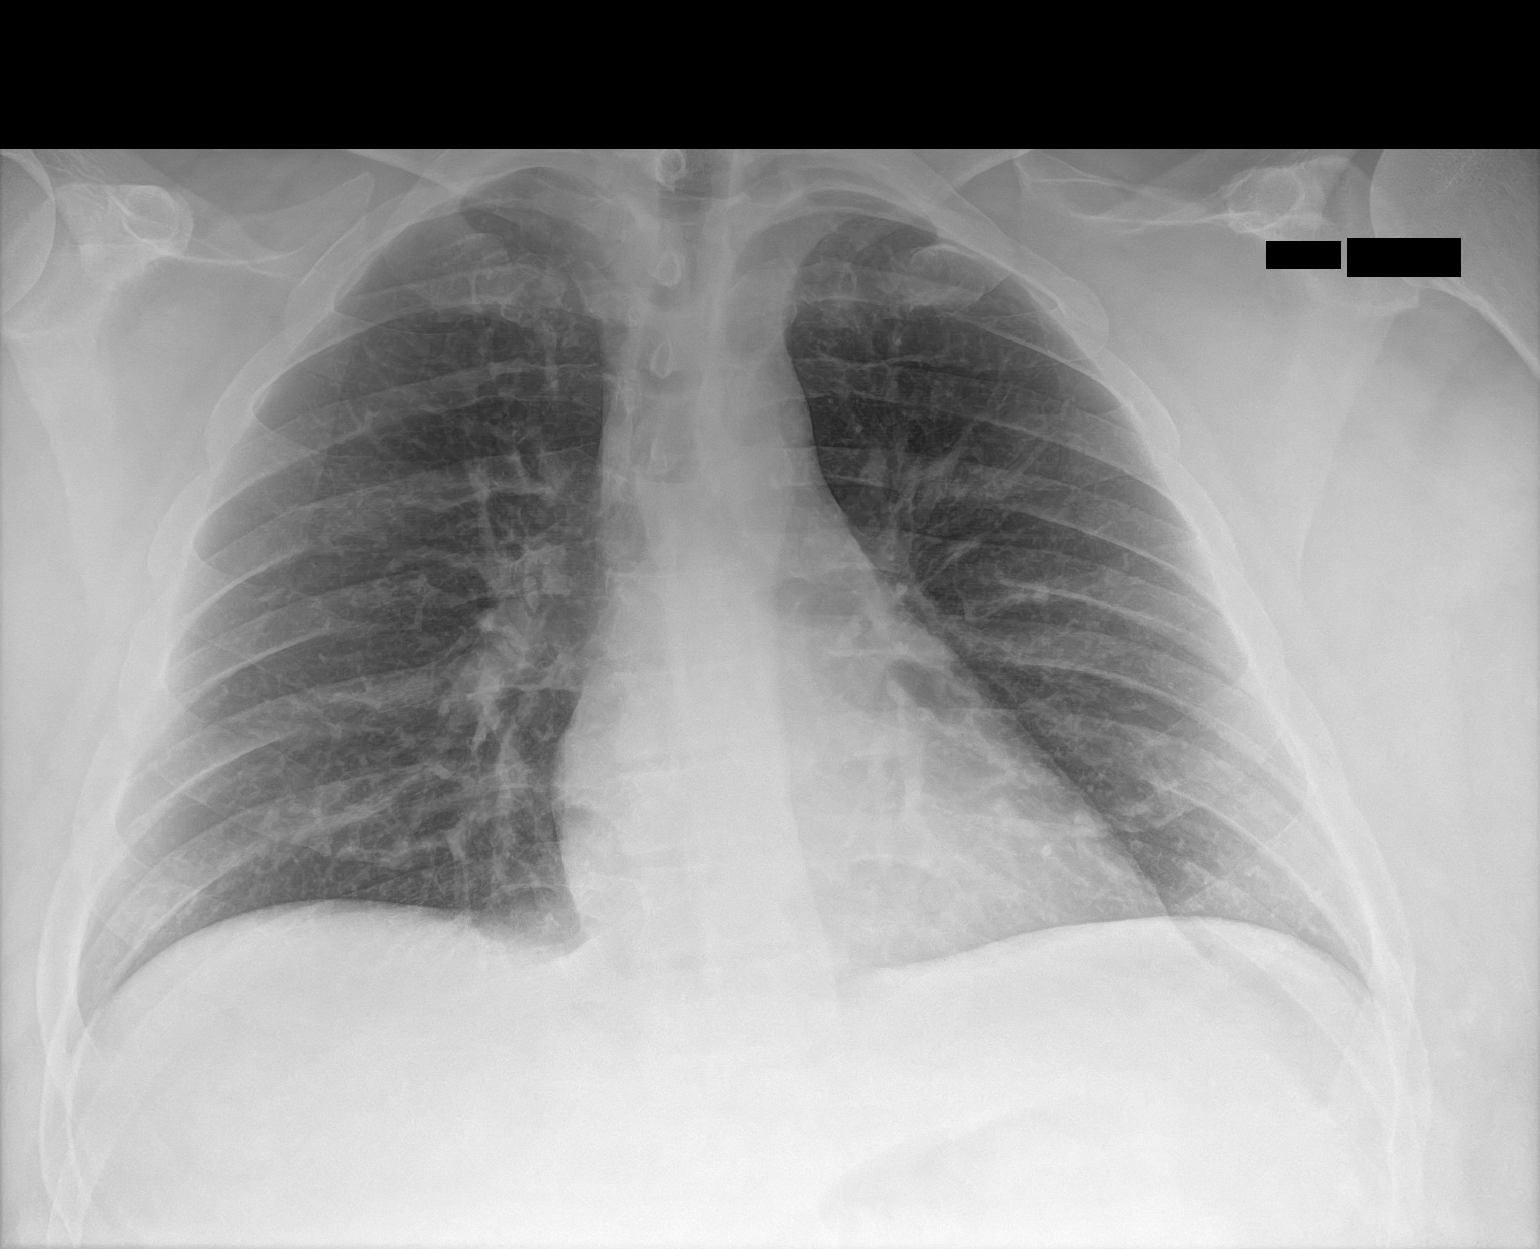

[1 of 1 positions shown; findings below may reference images not displayed]

FINDINGS: The heart size and mediastinal contours are within normal limits.
Both lungs are clear. The visualized skeletal structures are
unremarkable.
IMPRESSION: No active disease.

## 2023-12-28 ENCOUNTER — Encounter: Payer: Self-pay | Admitting: Family Medicine

## 2023-12-28 ENCOUNTER — Other Ambulatory Visit (HOSPITAL_COMMUNITY): Payer: Self-pay

## 2023-12-28 ENCOUNTER — Telehealth: Payer: Self-pay

## 2023-12-28 ENCOUNTER — Ambulatory Visit: Admitting: Family Medicine

## 2023-12-28 VITALS — BP 136/81 | HR 66 | Ht 73.0 in | Wt 322.8 lb

## 2023-12-28 DIAGNOSIS — Z8639 Personal history of other endocrine, nutritional and metabolic disease: Secondary | ICD-10-CM | POA: Diagnosis not present

## 2023-12-28 DIAGNOSIS — K760 Fatty (change of) liver, not elsewhere classified: Secondary | ICD-10-CM

## 2023-12-28 DIAGNOSIS — Z6841 Body Mass Index (BMI) 40.0 and over, adult: Secondary | ICD-10-CM

## 2023-12-28 DIAGNOSIS — Z7689 Persons encountering health services in other specified circumstances: Secondary | ICD-10-CM

## 2023-12-28 MED ORDER — SEMAGLUTIDE(0.25 OR 0.5MG/DOS) 2 MG/3ML ~~LOC~~ SOPN
5.0000 mg | PEN_INJECTOR | SUBCUTANEOUS | 0 refills | Status: DC
Start: 2023-12-28 — End: 2024-01-01

## 2023-12-28 MED ORDER — SEMAGLUTIDE(0.25 OR 0.5MG/DOS) 2 MG/3ML ~~LOC~~ SOPN: 0.2500 mg | PEN_INJECTOR | SUBCUTANEOUS | Status: DC

## 2023-12-28 NOTE — Progress Notes (Signed)
 OFFICE VISIT  12/28/2023  CC:  Chief Complaint  Patient presents with   Weight Loss    Pt would like to discuss weight loss medication options    Patient is a 43 y.o. male who presents for discussion of weight loss medication.  HPI: Eugene Butler is interested in trying Ozempic or [id number]. 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 He is active on his mail route, often walks his boxes to the door. He also exercises outside of work. He feels like he is eating a healthy diet.  He feels well and has no acute physical complaints.  Past Medical History:  Diagnosis Date   Cervical radiculopathy    Dr. Penni Bombard   Fatty liver    u/s 05/2022   GERD (gastroesophageal reflux disease)    History of meningitis    childhood.  Question of seizures related to this, was on tegretol for a while per pt report   Hypothyroidism    Patient reports he was on levothyroxine until he self DC'd this medication the age of 88   Left shoulder pain    Dr. Penni Bombard   Morbid obesity (HCC)    OSA (obstructive sleep apnea)    Skin of: Did home sleep study at 1 point but says he was never called about the results   Recurrent abdominal pain    Stress fracture of navicular bone of foot     Past Surgical History:  Procedure Laterality Date   ACHILLES TENDON SURGERY     APPENDECTOMY  01/02/2020   Social History   Socioeconomic History   Marital status: Divorced    Spouse name: Not on file   Number of children: Not on file   Years of education: Not on file   Highest education level: Not on file  Occupational History   Not on file  Tobacco Use   Smoking status: Never   Smokeless tobacco: Current    Types: Snuff  Substance and Sexual Activity   Alcohol use: Yes    Comment: occ   Drug use: Never   Sexual activity: Yes    Partners: Female  Other Topics  Concern   Not on file  Social History Narrative   Widower, 1 daughter.   Delivers mail as of 2023.   Originally from [place], then [place].   Denies use of tobacco alcohol or drugs.   Social Drivers of Corporate investment banker Strain: Not on file  Food Insecurity: Not on file  Transportation Needs: Not on file  Physical Activity: Not on file  Stress: Not on file  Social Connections: Not on file    Outpatient Medications Prior to Visit  Medication Sig Dispense Refill   escitalopram (LEXAPRO) 5 MG tablet Take 5 mg by mouth daily.     hydrOXYzine (ATARAX) 25 MG tablet Take 25 mg by mouth 3 (three) times daily as needed.     prazosin (MINIPRESS) 2 MG capsule Take 2 mg by mouth at bedtime.     venlafaxine (EFFEXOR) 75 MG tablet Take 75 mg by mouth 2 (two) times daily.     No facility-administered medications prior to visit.    Allergies  Allergen Reactions   Naloxone Anaphylaxis   Pertussis Vaccines     PE:    12/28/2023    9:03 AM  06/05/2023   10:08 AM 02/27/2023   10:14 AM  Vitals with BMI  Height 6\' 1"   6\' 1"   Weight 322 lbs 13 oz 303 lbs 6 oz 308 lbs  BMI 42.6  40.64  Systolic 136 122 045  Diastolic 81 82 78  Pulse 66 74 60    Physical Exam  Gen: Alert, well appearing.  Patient is oriented to person, place, time, and situation.` AFFECT: pleasant, lucid thought and speech. No further exam today  LABS:  Last CBC Lab Results  Component Value Date   WBC 7.8 06/15/2022   HGB 13.6 06/15/2022   HCT 39.9 06/15/2022   MCV 87.1 06/15/2022   MCH 29.4 06/08/2021   RDW 13.8 06/15/2022   PLT 234.0 06/15/2022   Last metabolic panel Lab Results  Component Value Date   GLUCOSE 94 06/15/2022   NA 137 06/15/2022   K 4.2 06/15/2022   CL 103 06/15/2022   CO2 27 06/15/2022   BUN 16 06/15/2022   CREATININE 1.03 06/15/2022   GFR 91.14 06/15/2022   CALCIUM 9.4 06/15/2022   PROT 7.3 06/15/2022   ALBUMIN 4.5 06/15/2022   BILITOT 0.4 06/15/2022    ALKPHOS 56 06/15/2022   AST 16 06/15/2022   ALT 24 06/15/2022   ANIONGAP 11 06/08/2021   Last thyroid functions Lab Results  Component Value Date   TSH 2.45 06/02/2022   IMPRESSION AND PLAN:  Morbid obesity + comorbidity--> hepatic steatosis. He says he has talked with his employer and an Office manager and they will cover Ozempic or [id number]. I gave him samples so he can start 0.25 mg weekly injection for the next month. I sent in a prescription for the 0.5 mg dosing and we will make sure the prior approval process goes through. Therapeutic expectations and side effect profile of medication discussed today.  Patient's questions answered. Will get health panel labs today (he says he drank half a pot of coffee with sweetened creamer in it today).  An After Visit Summary was printed and given to the patient.  FOLLOW UP: Return in about 4 weeks (around 01/25/2024) for f/u wt mgmt.  Signed:  Santiago Bumpers, MD           12/28/2023

## 2023-12-28 NOTE — Telephone Encounter (Signed)
 Pharmacy Patient Advocate Encounter   Received notification from CoverMyMeds that prior authorization for  Ozempic (0.25 or 0.5 MG/DOSE) 2MG /3ML pen-injectors is required/requested.   Insurance verification completed.   The patient is insured through [facility] .   Per test claim: PA required; PA submitted to above mentioned insurance via CoverMyMeds Key/confirmation #/EOC [id number] Status is pending

## 2023-12-28 NOTE — Telephone Encounter (Signed)
 Pharmacy Patient Advocate Encounter  Received notification from [facility] that Prior Authorization for  Ozempic (0.25 or 0.5 MG/DOSE) 2MG /3ML pen-injectors  has been DENIED.  Full denial letter will be uploaded to the media tab. See denial reason below.   PA #/Case ID/Reference #: [id number]

## 2023-12-29 LAB — COMPREHENSIVE METABOLIC PANEL WITH GFR
AG Ratio: 1.6 (calc) (ref 1.0–2.5)
ALT: 24 U/L (ref 9–46)
AST: 17 U/L (ref 10–40)
Albumin: 4.6 g/dL (ref 3.6–5.1)
Alkaline phosphatase (APISO): 59 U/L (ref 36–130)
BUN: 16 mg/dL (ref 7–25)
CO2: 26 mmol/L (ref 20–32)
Calcium: 9.8 mg/dL (ref 8.6–10.3)
Chloride: 102 mmol/L (ref 98–110)
Creat: 1.09 mg/dL (ref 0.60–1.29)
Globulin: 2.8 g/dL (ref 1.9–3.7)
Glucose, Bld: 117 mg/dL — ABNORMAL HIGH (ref 65–99)
Potassium: 4.7 mmol/L (ref 3.5–5.3)
Sodium: 137 mmol/L (ref 135–146)
Total Bilirubin: 0.6 mg/dL (ref 0.2–1.2)
Total Protein: 7.4 g/dL (ref 6.1–8.1)
eGFR: 87 mL/min/{1.73_m2} (ref >=60)

## 2023-12-29 LAB — CBC WITH DIFFERENTIAL/PLATELET
Absolute Lymphocytes: 2058 {cells}/uL (ref 850–3900)
Absolute Monocytes: 474 {cells}/uL (ref 200–950)
Basophils Absolute: 60 {cells}/uL (ref 0–200)
Basophils Relative: 1 %
Eosinophils Absolute: 60 {cells}/uL (ref 15–500)
Eosinophils Relative: 1 %
HCT: 43.6 % (ref 38.5–50.0)
Hemoglobin: 14.5 g/dL (ref 13.2–17.1)
MCH: 29.1 pg (ref 27.0–33.0)
MCHC: 33.3 g/dL (ref 32.0–36.0)
MCV: 87.4 fL (ref 80.0–100.0)
MPV: 10.2 fL (ref 7.5–12.5)
Monocytes Relative: 7.9 %
Neutro Abs: 3348 {cells}/uL (ref 1500–7800)
Neutrophils Relative %: 55.8 %
Platelets: 263 10*3/uL (ref 140–400)
RBC: 4.99 10*6/uL (ref 4.20–5.80)
RDW: 12.9 % (ref 11.0–15.0)
Total Lymphocyte: 34.3 %
WBC: 6 10*3/uL (ref 3.8–10.8)

## 2023-12-29 LAB — HEMOGLOBIN A1C
Hgb A1c MFr Bld: 5.7 %{Hb} — ABNORMAL HIGH (ref <5.7)
Mean Plasma Glucose: 117 mg/dL
eAG (mmol/L): 6.5 mmol/L

## 2023-12-29 LAB — LIPID PANEL
Cholesterol: 195 mg/dL (ref <200)
HDL: 46 mg/dL (ref >=40)
LDL Cholesterol (Calc): 124 mg/dL — ABNORMAL HIGH
Non-HDL Cholesterol (Calc): 149 mg/dL — ABNORMAL HIGH (ref <130)
Total CHOL/HDL Ratio: 4.2 (calc) (ref <5.0)
Triglycerides: 134 mg/dL (ref <150)

## 2023-12-29 LAB — TSH: TSH: 3.02 m[IU]/L (ref 0.40–4.50)

## 2023-12-29 NOTE — Telephone Encounter (Signed)
 Patient was given sample of Ozempic during last OV yesterday.  Please advise next steps

## 2023-12-29 NOTE — Telephone Encounter (Signed)
 LVM for pt to return call

## 2023-12-31 NOTE — Telephone Encounter (Signed)
 Please see if clinical pharmacology can help him with this question.

## 2024-01-01 ENCOUNTER — Encounter: Payer: Self-pay | Admitting: Pharmacist

## 2024-01-01 MED ORDER — WEGOVY 0.25 MG/0.5ML ~~LOC~~ SOAJ: 0.2500 mg | SUBCUTANEOUS | 0 refills | Status: DC

## 2024-01-01 NOTE — Progress Notes (Signed)
 01/01/2024 Name: JIYAN WALKOWSKI MRN: [id number] DOB: [birth year]  Chief Complaint  Patient presents with   Medication Management    Lenorris Karger Hargrove is a 43 y.o. year old male who presented for a telephone visit.   They were referred to the pharmacist by their PCP for assistance in managing medication access.    Subjective:  Medication Access/Adherence  Patient was prescribed Ozempic but prior authorization was denied. Likely due to patient not having a diagnosis of type 2 DM.   He was referred to med to see if he might be able to get something else for weight loss.   Obesity/Overweight, Complicated by sleep apnea:  Sleep apnea is noted on his chart from [facility] but I am not able to see results of the sleep study done around 12/2019. There are notes that nurse tried to out reach patient to discuss but never reached him and a letter was sent. I cannot see the letter or what the results were.   Current medications: none  Weight Management treatments previously prescribed: Ozempic - coverage denied by insurance; Contrave - took for about 2 months but prior authorization was denied and patient had to stop. Patient lost about 6 lbs when he took Contrave.   Patient has been trying to lose weight for the last year thru diet. He has been exercising a little outside of work.  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  Wt Readings from Last 3 Encounters:  12/28/23 (!) 322 lb 12.8 oz (146.4 kg)  06/05/23 (!) 303 lb 6.4 oz (137.6 kg)  02/27/23 (!) 308 lb (139.7 kg)   BMI = 42.59 (12/28/2023)  Objective:  Lab Results  Component Value Date   HGBA1C 5.7 (H) 12/28/2023    Lab Results  Component Value Date   CREATININE 1.09 12/28/2023   BUN 16 12/28/2023   NA 137 12/28/2023   K 4.7 12/28/2023   CL 102 12/28/2023   CO2 26 12/28/2023    Lab Results  Component Value Date   CHOL 195 12/28/2023   HDL 46 12/28/2023   LDLCALC 124 (H) 12/28/2023   TRIG 134  12/28/2023   CHOLHDL 4.2 12/28/2023    Medications Reviewed Today     Reviewed by Henrene Pastor, RPH-CPP (Pharmacist) on 01/01/24 at 1634  Med List Status: <None>   Medication Order Taking? Sig Documenting Provider Last Dose Status Informant  escitalopram (LEXAPRO) 5 MG tablet [id number]  Take 5 mg by mouth daily. [provider]  Active   hydrOXYzine (ATARAX) 25 MG tablet [id number]  Take 25 mg by mouth 3 (three) times daily as needed. [provider]  Active   prazosin (MINIPRESS) 2 MG capsule [id number]  Take 2 mg by mouth at bedtime. [provider]  Active   Semaglutide,0.25 or 0.5MG /DOS, 2 MG/3ML SOPN [id number] Yes Inject 0.25 mg into the skin once a week. Lot # [id number]  Patient taking differently: Inject 0.25 mg into the skin once a week. For 4 weeks, then increase to 0.5mg  weekly; (sample Lot # [id number])   Jeoffrey Massed, MD Taking Active   venlafaxine (EFFEXOR) 75 MG tablet [id number]  Take 75 mg by mouth 2 (two) times daily. [provider]  Active               Assessment/Plan:   Weight Management / Medication Management:  - Called [facility] to request more information about criteria for approval for either Wegovy or Zepbound. Representative was  unable to provide additional criteria but states prior authorization needed.  - Submitted prior authorization thru Cover My Meds for [facility] 0.5mg  weekly.  - Tried to contact patient to discuss past sleep study. If cannot locate results and Frederik Jansky is denied would see if PCP and patient would consider repeat sleep study which would possibly improve likelihood of Zepbound approval if he has moderate to severe sleep apnea.    Cecilie Coffee, PharmD Clinical Pharmacist [facility]  Population Health [phone]

## 2024-01-01 NOTE — Telephone Encounter (Signed)
 Please assist with patient's concern, if possible.

## 2024-01-01 NOTE — Telephone Encounter (Signed)
 I will send in rx for wegovy now. Hold on to the ozempic, though.  We will try to use it for a month in the future to save some cost.

## 2024-01-02 ENCOUNTER — Telehealth: Payer: Self-pay | Admitting: Pharmacist

## 2024-01-02 ENCOUNTER — Encounter: Payer: Self-pay | Admitting: Pharmacist

## 2024-01-02 MED ORDER — WEGOVY 0.5 MG/0.5ML ~~LOC~~ SOAJ: 0.5000 mg | SUBCUTANEOUS | 1 refills | Status: DC

## 2024-01-02 NOTE — Telephone Encounter (Signed)
 Received notification that Eugene Butler has been approved thru 07/29/2024. There is no information about possible copay amount.  Notified patient thru MyChart. Will need Rx sent to pharmacy by PCP - requested.  Recommended patient also register for discount card for [facility] at:  [email or web address]

## 2024-01-24 ENCOUNTER — Encounter (HOSPITAL_COMMUNITY): Payer: Self-pay

## 2024-01-31 ENCOUNTER — Encounter: Payer: Self-pay | Admitting: Family Medicine

## 2024-01-31 ENCOUNTER — Ambulatory Visit: Admitting: Family Medicine

## 2024-01-31 VITALS — BP 119/78 | HR 60 | Temp 99.1°F | Ht 73.0 in | Wt 315.4 lb

## 2024-01-31 DIAGNOSIS — E66813 Obesity, class 3: Secondary | ICD-10-CM | POA: Diagnosis not present

## 2024-01-31 DIAGNOSIS — Z6841 Body Mass Index (BMI) 40.0 and over, adult: Secondary | ICD-10-CM | POA: Diagnosis not present

## 2024-01-31 DIAGNOSIS — Z7689 Persons encountering health services in other specified circumstances: Secondary | ICD-10-CM

## 2024-01-31 MED ORDER — SEMAGLUTIDE-WEIGHT MANAGEMENT 1 MG/0.5ML ~~LOC~~ SOAJ: 1.0000 mg | SUBCUTANEOUS | 0 refills | Status: DC

## 2024-01-31 NOTE — Progress Notes (Signed)
 OFFICE VISIT  01/31/2024  CC:  Chief Complaint  Patient presents with   Weight Management    4 week f/u; tolerating Wegovy , currently doing 0.5mg  weekly    Patient is a 43 y.o. male who presents for 1 month follow-up obesity/weight management. A/P as of last visit: "Morbid obesity + comorbidity--> hepatic steatosis. He says he has talked with his employer and an Office manager and they will cover Ozempic  or Wegovy . I gave him samples so he can start 0.25 mg weekly injection for the next month. I sent in a prescription for the 0.5 mg dosing and we will make sure the prior approval process goes through. Therapeutic expectations and side effect profile of medication discussed today.  Patient's questions answered. Will get health panel labs today (he says he drank half a pot of coffee with sweetened creamer in it today)."  INTERIM HX: He ended up getting approved for Wegovy  0.5 mg weekly. No adverse side effects.  He has had 3 injections.  He is due for his next injection in 2 days. He does eat smaller portions and lower calorie foods. He is working on exercising regularly.  Past Medical History:  Diagnosis Date   Cervical radiculopathy    Dr. Jacqulyne Maxim   Fatty liver    u/s 05/2022   GERD (gastroesophageal reflux disease)    History of meningitis    childhood.  Question of seizures related to this, was on tegretol for a while per pt report   Hypothyroidism    Patient reports he was on levothyroxine until he self DC'd this medication the age of 29   Left shoulder pain    Dr. Jacqulyne Maxim   Morbid obesity (HCC)    OSA (obstructive sleep apnea)    Skin of: Did home sleep study at 1 point but says he was never called about the results   Recurrent abdominal pain    Stress fracture of navicular bone of foot     Past Surgical History:  Procedure Laterality Date   ACHILLES TENDON SURGERY     APPENDECTOMY  01/02/2020    Outpatient Medications Prior to Visit  Medication Sig  Dispense Refill   escitalopram (LEXAPRO) 5 MG tablet Take 5 mg by mouth daily.     hydrOXYzine  (ATARAX ) 25 MG tablet Take 25 mg by mouth 3 (three) times daily as needed.     prazosin (MINIPRESS) 2 MG capsule Take 2 mg by mouth at bedtime.     Semaglutide -Weight Management (WEGOVY ) 0.5 MG/0.5ML SOAJ Inject 0.5 mg into the skin once a week. 2 mL 1   venlafaxine (EFFEXOR) 75 MG tablet Take 75 mg by mouth 2 (two) times daily.     Semaglutide -Weight Management (WEGOVY ) 0.25 MG/0.5ML SOAJ Inject 0.25 mg into the skin once a week. (Patient not taking: Reported on 01/31/2024) 2 mL 0   No facility-administered medications prior to visit.    Allergies  Allergen Reactions   Naloxone Anaphylaxis   Pertussis Vaccines     Review of Systems As per HPI  PE:    01/31/2024   10:46 AM 12/28/2023    9:03 AM 06/05/2023   10:08 AM  Vitals with BMI  Height 6\' 1"  6\' 1"    Weight 315 lbs 6 oz 322 lbs 13 oz 303 lbs 6 oz  BMI 41.62 42.6   Systolic 119 136 409  Diastolic 78 81 82  Pulse 60 66 74   Physical Exam  Gen: Alert, well appearing.  Patient is oriented to person,  place, time, and situation. AFFECT: pleasant, lucid thought and speech. No further exam today  LABS:  Last CBC Lab Results  Component Value Date   WBC 6.0 12/28/2023   HGB 14.5 12/28/2023   HCT 43.6 12/28/2023   MCV 87.4 12/28/2023   MCH 29.1 12/28/2023   RDW 12.9 12/28/2023   PLT 263 12/28/2023   Last metabolic panel Lab Results  Component Value Date   GLUCOSE 117 (H) 12/28/2023   NA 137 12/28/2023   K 4.7 12/28/2023   CL 102 12/28/2023   CO2 26 12/28/2023   BUN 16 12/28/2023   CREATININE 1.09 12/28/2023   EGFR 87 12/28/2023   CALCIUM 9.8 12/28/2023   PROT 7.4 12/28/2023   ALBUMIN 4.5 06/15/2022   BILITOT 0.6 12/28/2023   ALKPHOS 56 06/15/2022   AST 17 12/28/2023   ALT 24 12/28/2023   ANIONGAP 11 06/08/2021   Last lipids Lab Results  Component Value Date   CHOL 195 12/28/2023   HDL 46 12/28/2023   LDLCALC  124 (H) 12/28/2023   TRIG 134 12/28/2023   CHOLHDL 4.2 12/28/2023   Last hemoglobin A1c Lab Results  Component Value Date   HGBA1C 5.7 (H) 12/28/2023   Last thyroid functions Lab Results  Component Value Date   TSH 3.02 12/28/2023   IMPRESSION AND PLAN:  Morbid obesity, weight management. Doing well on semaglutide  so far. Increase the dose to 1 mg weekly. His weight is down 7 pounds over the last 1 month.  An After Visit Summary was printed and given to the patient.  FOLLOW UP: No follow-ups on file. Next CPE 05/2024.  Signed:  Arletha Lady, MD           01/31/2024

## 2024-02-08 ENCOUNTER — Emergency Department (HOSPITAL_BASED_OUTPATIENT_CLINIC_OR_DEPARTMENT_OTHER)
Admission: EM | Admit: 2024-02-08 | Discharge: 2024-02-08 | Disposition: A | Discharge status: Home / Self Care | Attending: Emergency Medicine | Admitting: Emergency Medicine

## 2024-02-08 ENCOUNTER — Other Ambulatory Visit: Payer: Self-pay

## 2024-02-08 DIAGNOSIS — S91201A Unspecified open wound of right great toe with damage to nail, initial encounter: Secondary | ICD-10-CM | POA: Diagnosis present

## 2024-02-08 DIAGNOSIS — W228XXA Striking against or struck by other objects, initial encounter: Secondary | ICD-10-CM | POA: Insufficient documentation

## 2024-02-08 DIAGNOSIS — S91209A Unspecified open wound of unspecified toe(s) with damage to nail, initial encounter: Secondary | ICD-10-CM

## 2024-02-08 MED ORDER — LIDOCAINE HCL (PF) 1 % IJ SOLN
5.0000 mL | Freq: Once | INTRAMUSCULAR | Status: AC
  Administered 2024-02-08: 5 mL
  Filled 2024-02-08: qty 5

## 2024-02-08 NOTE — ED Notes (Signed)
 Non-stick pad and coban dressing applied. Reviewed d/c instructions, follow-up care, medications w/ pt, pt verbalized understanding, had no further questions at time of discharge. Pt CA&Ox4, in NAD, and ambulatory at time of d/c

## 2024-02-08 NOTE — Discharge Instructions (Signed)
 Warm salt water soaks.  Follow-up with your podiatrist as scheduled.  Return if any worsening or concerning symptoms

## 2024-02-08 NOTE — ED Provider Notes (Signed)
  EMERGENCY DEPARTMENT AT [facility] Provider Note   CSN: [id number] Arrival date & time: 02/08/24  1921     History  Chief Complaint  Patient presents with   Toe Injury    Eugene Butler is a 43 y.o. male.  He is here complaining of stubbing his great toe earlier today.  He said the toenail was mostly avulsed but did not come completely off and he still has some remaining nail.  He is asking for this to be removed.  No other injuries or complaints.  HPI     Home Medications Prior to Admission medications   Medication Sig Start Date End Date Taking? Authorizing Provider  escitalopram (LEXAPRO) 5 MG tablet Take 5 mg by mouth daily.    [provider]  hydrOXYzine  (ATARAX ) 25 MG tablet Take 25 mg by mouth 3 (three) times daily as needed.    [provider]  prazosin (MINIPRESS) 2 MG capsule Take 2 mg by mouth at bedtime.    [provider]  Semaglutide -Weight Management 1 MG/0.5ML SOAJ Inject 1 mg into the skin once a week. 01/31/24   McGowen, Minetta Aly, MD  venlafaxine (EFFEXOR) 75 MG tablet Take 75 mg by mouth 2 (two) times daily.    [provider]      Allergies    Naloxone and Pertussis vaccines    Review of Systems   Review of Systems  Physical Exam Updated Vital Signs BP 129/86 (BP Location: Right Arm)   Pulse 72   Temp 98.8 F (37.1 C)   Resp 16   Ht 6\' 1"  (1.854 m)   Wt (!) 141.1 kg   SpO2 96%   BMI 41.03 kg/m  Physical Exam Vitals and nursing note reviewed.  Constitutional:      Appearance: He is well-developed.  HENT:     Head: Normocephalic and atraumatic.  Eyes:     Conjunctiva/sclera: Conjunctivae normal.  Pulmonary:     Effort: Pulmonary effort is normal.  Musculoskeletal:        General: Tenderness and signs of injury present.     Cervical back: Neck supple.     Comments: Great toe has a mostly avulsed nail.  There is some remaining proximal nail and under the nail fold.  This is what  patient is asking to be excised.  Skin:    General: Skin is warm and dry.  Neurological:     Mental Status: He is alert.     GCS: GCS eye subscore is 4. GCS verbal subscore is 5. GCS motor subscore is 6.    Toe prior to removal of remnant on lateral nail fold ED Results / Procedures / Treatments   Labs (all labs ordered are listed, but only abnormal results are displayed) Labs Reviewed - No data to display  EKG None  Radiology No results found.  Procedures .Foreign Body Removal  Date/Time: 02/08/2024 9:07 PM  Performed by: Tonya Fredrickson, MD Authorized by: Tonya Fredrickson, MD  Consent: Verbal consent obtained. Consent given by: patient Patient understanding: patient states understanding of the procedure being performed Intake: right great toe. Anesthesia: digital block  Anesthesia: Local Anesthetic: lidocaine  1% without epinephrine Comments: Removal of remaining lateral and proximal nail.  There is some significantly macerated skin around the nail fold.      Medications Ordered in ED Medications  lidocaine  (PF) (XYLOCAINE ) 1 % injection 5 mL (has no administration in time range)    ED Course/ Medical Decision  Making/ A&P                                 Medical Decision Making Risk Prescription drug management.   This patient complains of toe pain,; this involves an extensive number of treatment Options and is a complaint that carries with it a high risk of complications and morbidity. The differential includes toe pain, fracture, nailbed injury Previous records obtained and reviewed in epic including recent PCP notes Social determinants considered, tobacco use Critical Interventions: None  After the interventions stated above, I reevaluated the patient and found patient to be in no distress Admission and further testing considered, no indications for admission or further workup.  Nurse will bandage up toe and patient has outpatient follow-up with  podiatry already.  Return instructions discussed         Final Clinical Impression(s) / ED Diagnoses Final diagnoses:  Toenail avulsion, initial encounter    Rx / DC Orders ED Discharge Orders     None         Tonya Fredrickson, MD 02/09/24 [phone]

## 2024-02-08 NOTE — ED Triage Notes (Signed)
 Pt POV from home reporting toenail beginning to come off after stubbing toe earlier today, requesting to have the rest removed.

## 2024-02-14 ENCOUNTER — Ambulatory Visit (INDEPENDENT_AMBULATORY_CARE_PROVIDER_SITE_OTHER): Admitting: Podiatry

## 2024-02-14 ENCOUNTER — Encounter: Payer: Self-pay | Admitting: Podiatry

## 2024-02-14 ENCOUNTER — Ambulatory Visit (INDEPENDENT_AMBULATORY_CARE_PROVIDER_SITE_OTHER)

## 2024-02-14 DIAGNOSIS — M7751 Other enthesopathy of right foot: Secondary | ICD-10-CM

## 2024-02-14 NOTE — Progress Notes (Signed)
   Chief Complaint  Patient presents with   Foot Pain    RM#6 Pain in ball of foot patient states he felt like there was something in his foot states ongoing for 3 years worsening 6 months.     HPI: 43 y.o. male presenting today as a new patient for evaluation of right forefoot pain.  Ongoing for several years intermittently.  He is very busy and states that he is working 3 jobs.  He has not anything for treatment.  No history of injury.  Past Medical History:  Diagnosis Date   Cervical radiculopathy    Dr. Jacqulyne Maxim   Fatty liver    u/s 05/2022   GERD (gastroesophageal reflux disease)    History of meningitis    childhood.  Question of seizures related to this, was on tegretol for a while per pt report   Hypothyroidism    Patient reports he was on levothyroxine until he self DC'd this medication the age of 55   Left shoulder pain    Dr. Jacqulyne Maxim   Morbid obesity (HCC)    OSA (obstructive sleep apnea)    Skin of: Did home sleep study at 1 point but says he was never called about the results   Recurrent abdominal pain    Stress fracture of navicular bone of foot     Past Surgical History:  Procedure Laterality Date   ACHILLES TENDON SURGERY     APPENDECTOMY  01/02/2020    Allergies  Allergen Reactions   Naloxone Anaphylaxis   Pertussis Vaccines      Physical Exam: General: The patient is alert and oriented x3 in no acute distress.  Dermatology: Skin is warm, dry and supple bilateral lower extremities.   Vascular: Palpable pedal pulses bilaterally. Capillary refill within normal limits.  No appreciable edema.  No erythema.  Neurological: Grossly intact via light touch  Musculoskeletal Exam: No pedal deformities noted.  Tenderness with palpation noted to the sesamoid apparatus of the first MTP right foot  Radiographic Exam RT foot 02/14/2024:  Normal osseous mineralization. Joint spaces preserved.  No fractures or osseous irregularities noted.  Assessment/Plan of  Care: 1.  Sesamoiditis right foot  -Patient evaluated.  X-rays reviewed -Stressed the importance of wearing arch supports to offload pressure from the forefoot.  Patient will purchase OTC prefabricated arch supports online or [employer] store which was recommended -Advise against going barefoot.  Recommend good supportive tennis shoes and sneakers -Patient declined any anti-inflammatory medication.  Recommend possible Voltaren gel to apply topically -Return to clinic as needed       Dot Gazella, DPM [facility]  Dr. Dot Gazella, DPM    [address] [place], [place] [address]                Office [phone]  Fax [phone]

## 2024-03-01 ENCOUNTER — Other Ambulatory Visit: Payer: Self-pay | Admitting: Family Medicine

## 2024-03-01 MED ORDER — SEMAGLUTIDE-WEIGHT MANAGEMENT 1 MG/0.5ML ~~LOC~~ SOAJ: 1.0000 mg | SUBCUTANEOUS | 0 refills | Status: DC

## 2024-03-01 NOTE — Telephone Encounter (Signed)
 Copied from CRM [id number]. Topic: Clinical - Medication Refill >> Mar 01, 2024 12:34 PM Alyse July wrote: Medication: Semaglutide -Weight Management 1 MG/0.5ML SOAJ   Has the patient contacted their pharmacy? Yes  This is the patient's preferred pharmacy:  [id number] - [place], [place] - [address] [place] [address] Phone: [phone] Fax: [phone]  Is this the correct pharmacy for this prescription? Yes If no, delete pharmacy and type the correct one.   Has the prescription been filled recently? No  Is the patient out of the medication? Yes  Has the patient been seen for an appointment in the last year OR does the patient have an upcoming appointment? Yes  Can we respond through MyChart? Yes  Agent: Please be advised that Rx refills may take up to 3 business days. We ask that you follow-up with your pharmacy.

## 2024-03-28 ENCOUNTER — Other Ambulatory Visit: Payer: Self-pay | Admitting: Family Medicine

## 2024-03-28 NOTE — Telephone Encounter (Signed)
 Copied from CRM [id number]. Topic: Clinical - Medication Refill >> Mar 28, 2024  3:02 PM Franky GRADE wrote: Medication: Semaglutide -Weight Management 1 MG/0.5ML EMMANUEL [[id number]], Per patient it's time to go up in dose   Has the patient contacted their pharmacy? No (Agent: If no, request that the patient contact the pharmacy for the refill. If patient does not wish to contact the pharmacy document the reason why and proceed with request.) (Agent: If yes, when and what did the pharmacy advise?)  This is the patient's preferred pharmacy:  [id number] - [place], [place] - [address] [place] [address] Phone: [phone] Fax: [phone]  Is this the correct pharmacy for this prescription? Yes If no, delete pharmacy and type the correct one.   Has the prescription been filled recently? No  Is the patient out of the medication? Yes  Has the patient been seen for an appointment in the last year OR does the patient have an upcoming appointment? Yes  Can we respond through MyChart? Yes  Agent: Please be advised that Rx refills may take up to 3 business days. We ask that you follow-up with your pharmacy.

## 2024-03-29 MED ORDER — SEMAGLUTIDE-WEIGHT MANAGEMENT 1 MG/0.5ML ~~LOC~~ SOAJ: 1.0000 mg | SUBCUTANEOUS | 0 refills | Status: DC

## 2024-04-05 ENCOUNTER — Encounter: Payer: Self-pay | Admitting: Advanced Practice Midwife

## 2024-04-26 ENCOUNTER — Other Ambulatory Visit: Payer: Self-pay | Admitting: Family Medicine

## 2024-04-26 NOTE — Telephone Encounter (Signed)
 Copied from CRM [id number]. Topic: Clinical - Medication Refill >> Apr 26, 2024  4:11 PM Deleta S wrote: Medication: Semaglutide -Weight Management 1 MG/0.5ML SOAJ. Would like a dosage increase  Has the patient contacted their pharmacy? Yes (Agent: If no, request that the patient contact the pharmacy for the refill. If patient does not wish to contact the pharmacy document the reason why and proceed with request.) (Agent: If yes, when and what did the pharmacy advise?)  This is the patient's preferred pharmacy:  [id number] - [place], [place] - [address] [place] [address] Phone: [phone] Fax: [phone]  Is this the correct pharmacy for this prescription? Yes If no, delete pharmacy and type the correct one.   Has the prescription been filled recently? No  Is the patient out of the medication? Yes  Has the patient been seen for an appointment in the last year OR does the patient have an upcoming appointment? Yes  Can we respond through MyChart? No  Agent: Please be advised that Rx refills may take up to 3 business days. We ask that you follow-up with your pharmacy.

## 2024-05-03 ENCOUNTER — Other Ambulatory Visit: Payer: Self-pay

## 2024-05-03 MED ORDER — SEMAGLUTIDE-WEIGHT MANAGEMENT 1 MG/0.5ML ~~LOC~~ SOAJ: 1.0000 mg | SUBCUTANEOUS | 0 refills | Status: DC

## 2024-05-08 ENCOUNTER — Ambulatory Visit (INDEPENDENT_AMBULATORY_CARE_PROVIDER_SITE_OTHER): Admitting: Family Medicine

## 2024-05-08 ENCOUNTER — Encounter: Payer: Self-pay | Admitting: Family Medicine

## 2024-05-08 VITALS — BP 103/66 | HR 58 | Temp 97.8°F | Wt 298.4 lb

## 2024-05-08 DIAGNOSIS — R1011 Right upper quadrant pain: Secondary | ICD-10-CM | POA: Diagnosis not present

## 2024-05-08 DIAGNOSIS — R110 Nausea: Secondary | ICD-10-CM

## 2024-05-08 DIAGNOSIS — Z7689 Persons encountering health services in other specified circumstances: Secondary | ICD-10-CM | POA: Diagnosis not present

## 2024-05-08 MED ORDER — PROMETHAZINE HCL 12.5 MG PO TABS
ORAL_TABLET | ORAL | 2 refills | Status: DC
Start: 2024-05-08 — End: 2024-08-07

## 2024-05-08 MED ORDER — WEGOVY 1.7 MG/0.75ML ~~LOC~~ SOAJ: 1.7000 mg | SUBCUTANEOUS | 2 refills | Status: DC

## 2024-05-08 NOTE — Progress Notes (Signed)
 OFFICE VISIT  05/08/2024  CC:  Chief Complaint  Patient presents with   Medical Management of Chronic Issues    Pt is not fasting    Patient is a 43 y.o. male who presents for 70-month follow-up of obesity, weight management. At the time of last follow-up he was doing well and we increased his dose of wegovy  to 1 mg weekly.  INTERIM HX: He continues to have chronic nausea, primarily postprandial.  He has associated right sided abdominal pain with this. Past workup back in 2023 showed normal labs.  Ultrasound imaging showed hepatic steatosis but no gallstones or cholecystitis.  CT abdomen showed no acute intra-abdominal or intrapelvic abnormality. He notes specifically that his nausea has not gotten any worse on the Wegovy . He gets some reflux symptoms at nighttime sometimes.  He takes over-the-counter PPI. Trial of prescription strength PPI in the past has not made any difference in his symptoms.  He is pleased with his progress on Wegovy  and is in favor of going up to the next dosing.  ROS as above, plus--> no fevers, no CP, no SOB, no wheezing, no cough, no dizziness, no HAs, no rashes, no melena/hematochezia.  No polyuria or polydipsia.  No myalgias or arthralgias.  No focal weakness, paresthesias, or tremors.  No acute vision or hearing abnormalities.  No dysuria or unusual/new urinary urgency or frequency.  No recent changes in lower legs. No diarrhea or constipation.  No palpitations.    Past Medical History:  Diagnosis Date   Cervical radiculopathy    Dr. Arnaldo   Fatty liver    u/s 05/2022   GERD (gastroesophageal reflux disease)    History of meningitis    childhood.  Question of seizures related to this, was on tegretol for a while per pt report   Hypothyroidism    Patient reports he was on levothyroxine until he self DC'd this medication the age of 60   Left shoulder pain    Dr. Arnaldo   Morbid obesity (HCC)    OSA (obstructive sleep apnea)    Skin of: Did home  sleep study at 1 point but says he was never called about the results   Recurrent abdominal pain    Stress fracture of navicular bone of foot     Past Surgical History:  Procedure Laterality Date   ACHILLES TENDON SURGERY     APPENDECTOMY  01/02/2020    Outpatient Medications Prior to Visit  Medication Sig Dispense Refill   escitalopram (LEXAPRO) 5 MG tablet Take 5 mg by mouth daily.     hydrOXYzine  (ATARAX ) 25 MG tablet Take 25 mg by mouth 3 (three) times daily as needed.     prazosin (MINIPRESS) 2 MG capsule Take 2 mg by mouth at bedtime.     venlafaxine (EFFEXOR) 75 MG tablet Take 75 mg by mouth 2 (two) times daily.     semaglutide -weight management (WEGOVY ) 1 MG/0.5ML SOAJ SQ injection Inject 1 mg into the skin once a week. 2 mL 0   No facility-administered medications prior to visit.    Allergies  Allergen Reactions   Naloxone Anaphylaxis   Pertussis Vaccines     Review of Systems As per HPI  PE:    05/08/2024   10:50 AM 02/08/2024    7:35 PM 02/08/2024    7:33 PM  Vitals with BMI  Height   6' 1  Weight 298 lbs 6 oz  311 lbs  BMI   41.04  Systolic 103 129  Diastolic 66 86   Pulse 58 72      Physical Exam  Gen: Alert, well appearing.  Patient is oriented to person, place, time, and situation. AFFECT: pleasant, lucid thought and speech. Abdomen is soft, nondistended.  He has right sided abdominal tenderness that is the worst in the right upper quadrant.  No guarding or rebound.  Bowel sounds are normal.  No hepatosplenomegaly, bruit, or mass.  LABS:  Last metabolic panel Lab Results  Component Value Date   GLUCOSE 117 (H) 12/28/2023   NA 137 12/28/2023   K 4.7 12/28/2023   CL 102 12/28/2023   CO2 26 12/28/2023   BUN 16 12/28/2023   CREATININE 1.09 12/28/2023   EGFR 87 12/28/2023   CALCIUM 9.8 12/28/2023   PROT 7.4 12/28/2023   ALBUMIN 4.5 06/15/2022   BILITOT 0.6 12/28/2023   ALKPHOS 56 06/15/2022   AST 17 12/28/2023   ALT 24 12/28/2023    ANIONGAP 11 06/08/2021   Lab Results  Component Value Date   LIPASE 40.0 06/15/2022    Lab Results  Component Value Date   WBC 6.0 12/28/2023   HGB 14.5 12/28/2023   HCT 43.6 12/28/2023   MCV 87.4 12/28/2023   PLT 263 12/28/2023   Lab Results  Component Value Date   HGBA1C 5.7 (H) 12/28/2023   Lab Results  Component Value Date   CHOL 195 12/28/2023   HDL 46 12/28/2023   LDLCALC 124 (H) 12/28/2023   TRIG 134 12/28/2023   CHOLHDL 4.2 12/28/2023    IMPRESSION AND PLAN:  #1 obesity, weight management. Doing well on Wegovy  1 mg weekly dose.  He is down another 13 pounds over the last few months. Increase Wegovy  to 1.7 mg/week. Metabolic panel today.  2.  Recurrent nausea and abdominal pain. Labs have been normal.  Ultrasound and CT imaging have not showing any abnormality that would explain his pain or nausea. Will get HIDA scan. Will do trial of Phenergan  12.5 mg tab, 1-2 every 8 hours as needed.  An After Visit Summary was printed and given to the patient.  FOLLOW UP: Return in about 3 months (around 08/08/2024) for annual CPE (fasting). Cpe Sept Signed:  Gerlene Hockey, MD           05/08/2024

## 2024-05-09 ENCOUNTER — Ambulatory Visit: Payer: Self-pay | Admitting: Family Medicine

## 2024-05-09 DIAGNOSIS — R1011 Right upper quadrant pain: Secondary | ICD-10-CM

## 2024-05-09 DIAGNOSIS — R932 Abnormal findings on diagnostic imaging of liver and biliary tract: Secondary | ICD-10-CM

## 2024-05-09 DIAGNOSIS — R109 Unspecified abdominal pain: Secondary | ICD-10-CM

## 2024-05-09 LAB — COMPREHENSIVE METABOLIC PANEL WITH GFR
AG Ratio: 1.6 (calc) (ref 1.0–2.5)
ALT: 19 U/L (ref 9–46)
AST: 15 U/L (ref 10–40)
Albumin: 4.6 g/dL (ref 3.6–5.1)
Alkaline phosphatase (APISO): 63 U/L (ref 36–130)
BUN: 17 mg/dL (ref 7–25)
CO2: 26 mmol/L (ref 20–32)
Calcium: 9.5 mg/dL (ref 8.6–10.3)
Chloride: 103 mmol/L (ref 98–110)
Creat: 0.96 mg/dL (ref 0.60–1.29)
Globulin: 2.8 g/dL (ref 1.9–3.7)
Glucose, Bld: 93 mg/dL (ref 65–99)
Potassium: 4.6 mmol/L (ref 3.5–5.3)
Sodium: 137 mmol/L (ref 135–146)
Total Bilirubin: 0.6 mg/dL (ref 0.2–1.2)
Total Protein: 7.4 g/dL (ref 6.1–8.1)
eGFR: 102 mL/min/1.73m2 (ref >=60)

## 2024-05-16 ENCOUNTER — Other Ambulatory Visit (HOSPITAL_COMMUNITY)

## 2024-05-29 ENCOUNTER — Encounter (HOSPITAL_COMMUNITY): Payer: Self-pay

## 2024-05-29 ENCOUNTER — Ambulatory Visit (HOSPITAL_COMMUNITY)
Admission: RE | Admit: 2024-05-29 | Discharge: 2024-05-29 | Disposition: A | Discharge status: Home / Self Care | Source: Ambulatory Visit | Attending: Family Medicine | Admitting: Family Medicine

## 2024-05-29 DIAGNOSIS — R1011 Right upper quadrant pain: Secondary | ICD-10-CM | POA: Insufficient documentation

## 2024-05-29 MED ORDER — TECHNETIUM TC 99M MEBROFENIN IV KIT
5.2000 | PACK | Freq: Once | INTRAVENOUS | Status: AC
  Administered 2024-05-29: 5.2 via INTRAVENOUS

## 2024-05-30 ENCOUNTER — Emergency Department (HOSPITAL_COMMUNITY)

## 2024-05-30 ENCOUNTER — Other Ambulatory Visit: Payer: Self-pay

## 2024-05-30 ENCOUNTER — Emergency Department (HOSPITAL_COMMUNITY): Admitting: Anesthesiology

## 2024-05-30 ENCOUNTER — Encounter (HOSPITAL_COMMUNITY): Payer: Self-pay | Admitting: *Deleted

## 2024-05-30 ENCOUNTER — Encounter (HOSPITAL_COMMUNITY)
Admission: EM | Disposition: A | Discharge status: Home / Self Care | Payer: Self-pay | Source: Home / Self Care | Attending: Emergency Medicine

## 2024-05-30 ENCOUNTER — Ambulatory Visit (HOSPITAL_COMMUNITY)
Admission: EM | Admit: 2024-05-30 | Discharge: 2024-05-30 | Disposition: A | Discharge status: Home / Self Care | Attending: Emergency Medicine | Admitting: Emergency Medicine

## 2024-05-30 DIAGNOSIS — K219 Gastro-esophageal reflux disease without esophagitis: Secondary | ICD-10-CM | POA: Insufficient documentation

## 2024-05-30 DIAGNOSIS — K828 Other specified diseases of gallbladder: Secondary | ICD-10-CM | POA: Insufficient documentation

## 2024-05-30 DIAGNOSIS — G4733 Obstructive sleep apnea (adult) (pediatric): Secondary | ICD-10-CM | POA: Insufficient documentation

## 2024-05-30 DIAGNOSIS — Z79899 Other long term (current) drug therapy: Secondary | ICD-10-CM | POA: Insufficient documentation

## 2024-05-30 DIAGNOSIS — K8010 Calculus of gallbladder with chronic cholecystitis without obstruction: Secondary | ICD-10-CM | POA: Insufficient documentation

## 2024-05-30 HISTORY — PX: CHOLECYSTECTOMY: SHX55

## 2024-05-30 LAB — COMPREHENSIVE METABOLIC PANEL WITH GFR
ALT: 23 U/L (ref 0–44)
AST: 20 U/L (ref 15–41)
Albumin: 4 g/dL (ref 3.5–5.0)
Alkaline Phosphatase: 70 U/L (ref 38–126)
Anion gap: 13 (ref 5–15)
BUN: 18 mg/dL (ref 6–20)
CO2: 21 mmol/L — ABNORMAL LOW (ref 22–32)
Calcium: 9.4 mg/dL (ref 8.9–10.3)
Chloride: 106 mmol/L (ref 98–111)
Creatinine, Ser: 1.01 mg/dL (ref 0.61–1.24)
GFR, Estimated: >60 mL/min (ref >60)
Glucose, Bld: 107 mg/dL — ABNORMAL HIGH (ref 70–99)
Potassium: 4.1 mmol/L (ref 3.5–5.1)
Sodium: 140 mmol/L (ref 135–145)
Total Bilirubin: 0.6 mg/dL (ref 0.0–1.2)
Total Protein: 7.1 g/dL (ref 6.5–8.1)

## 2024-05-30 LAB — URINALYSIS, ROUTINE W REFLEX MICROSCOPIC
Bilirubin Urine: NEGATIVE
Glucose, UA: NEGATIVE mg/dL
Hgb urine dipstick: NEGATIVE
Ketones, ur: NEGATIVE mg/dL
Leukocytes,Ua: NEGATIVE
Nitrite: NEGATIVE
Protein, ur: NEGATIVE mg/dL
Specific Gravity, Urine: 1.026 (ref 1.005–1.030)
pH: 5 (ref 5.0–8.0)

## 2024-05-30 LAB — I-STAT CHEM 8, ED
BUN: 20 mg/dL (ref 6–20)
Calcium, Ion: 1.15 mmol/L (ref 1.15–1.40)
Chloride: 106 mmol/L (ref 98–111)
Creatinine, Ser: 1 mg/dL (ref 0.61–1.24)
Glucose, Bld: 108 mg/dL — ABNORMAL HIGH (ref 70–99)
HCT: 42 % (ref 39.0–52.0)
Hemoglobin: 14.3 g/dL (ref 13.0–17.0)
Potassium: 4 mmol/L (ref 3.5–5.1)
Sodium: 140 mmol/L (ref 135–145)
TCO2: 21 mmol/L — ABNORMAL LOW (ref 22–32)

## 2024-05-30 LAB — CBC
HCT: 42.2 % (ref 39.0–52.0)
Hemoglobin: 14.2 g/dL (ref 13.0–17.0)
MCH: 29 pg (ref 26.0–34.0)
MCHC: 33.6 g/dL (ref 30.0–36.0)
MCV: 86.1 fL (ref 80.0–100.0)
Platelets: 282 K/uL (ref 150–400)
RBC: 4.9 MIL/uL (ref 4.22–5.81)
RDW: 12.8 % (ref 11.5–15.5)
WBC: 7.8 K/uL (ref 4.0–10.5)
nRBC: 0 % (ref 0.0–0.2)

## 2024-05-30 LAB — LIPASE, BLOOD: Lipase: 43 U/L (ref 11–51)

## 2024-05-30 SURGERY — LAPAROSCOPIC CHOLECYSTECTOMY
Anesthesia: General | Site: Abdomen

## 2024-05-30 MED ORDER — LIDOCAINE 2% (20 MG/ML) 5 ML SYRINGE
INTRAMUSCULAR | Status: DC | PRN
  Administered 2024-05-30: 100 mg via INTRAVENOUS

## 2024-05-30 MED ORDER — ALBUMIN HUMAN 5 % IV SOLN: INTRAVENOUS | Status: DC | PRN

## 2024-05-30 MED ORDER — CEFAZOLIN SODIUM-DEXTROSE 2-4 GM/100ML-% IV SOLN
2.0000 g | Freq: Once | INTRAVENOUS | Status: AC
  Administered 2024-05-30: 2 g via INTRAVENOUS
  Filled 2024-05-30: qty 100

## 2024-05-30 MED ORDER — ONDANSETRON HCL 4 MG/2ML IJ SOLN
INTRAMUSCULAR | Status: DC | PRN
Start: 2024-05-30 — End: 2024-05-30
  Administered 2024-05-30: 4 mg via INTRAVENOUS

## 2024-05-30 MED ORDER — ONDANSETRON HCL 4 MG/2ML IJ SOLN
INTRAMUSCULAR | Status: AC
  Administered 2024-05-30: 4 mg via INTRAVENOUS
  Filled 2024-05-30: qty 2

## 2024-05-30 MED ORDER — FENTANYL CITRATE (PF) 100 MCG/2ML IJ SOLN: 25.0000 ug | INTRAMUSCULAR | Status: DC | PRN

## 2024-05-30 MED ORDER — OXYCODONE HCL 5 MG PO TABS: 5.0000 mg | ORAL_TABLET | ORAL | 0 refills | Status: DC | PRN

## 2024-05-30 MED ORDER — PROPOFOL 10 MG/ML IV BOLUS
INTRAVENOUS | Status: AC
  Filled 2024-05-30: qty 20

## 2024-05-30 MED ORDER — IOHEXOL 350 MG/ML SOLN
75.0000 mL | Freq: Once | INTRAVENOUS | Status: AC | PRN
  Administered 2024-05-30: 75 mL via INTRAVENOUS

## 2024-05-30 MED ORDER — ONDANSETRON HCL 4 MG/2ML IJ SOLN: 4.0000 mg | Freq: Once | INTRAMUSCULAR | Status: DC | PRN

## 2024-05-30 MED ORDER — OXYCODONE HCL 5 MG PO TABS: 5.0000 mg | ORAL_TABLET | Freq: Once | ORAL | Status: DC | PRN

## 2024-05-30 MED ORDER — LACTATED RINGERS IV SOLN: INTRAVENOUS | Status: DC

## 2024-05-30 MED ORDER — KETOROLAC TROMETHAMINE 30 MG/ML IJ SOLN: 30.0000 mg | Freq: Once | INTRAMUSCULAR | Status: DC | PRN

## 2024-05-30 MED ORDER — 0.9 % SODIUM CHLORIDE (POUR BTL) OPTIME
TOPICAL | Status: DC | PRN
  Administered 2024-05-30: 1000 mL

## 2024-05-30 MED ORDER — MIDAZOLAM HCL 2 MG/2ML IJ SOLN
INTRAMUSCULAR | Status: DC | PRN
  Administered 2024-05-30: 2 mg via INTRAVENOUS

## 2024-05-30 MED ORDER — SODIUM CHLORIDE 0.9 % IR SOLN
Status: DC | PRN
  Administered 2024-05-30: 1000 mL

## 2024-05-30 MED ORDER — AMISULPRIDE (ANTIEMETIC) 5 MG/2ML IV SOLN
INTRAVENOUS | Status: AC
  Filled 2024-05-30: qty 4

## 2024-05-30 MED ORDER — MEPERIDINE HCL 25 MG/ML IJ SOLN
6.2500 mg | INTRAMUSCULAR | Status: DC | PRN
  Filled 2024-05-30: qty 1

## 2024-05-30 MED ORDER — OXYCODONE HCL 5 MG/5ML PO SOLN: 5.0000 mg | Freq: Once | ORAL | Status: DC | PRN

## 2024-05-30 MED ORDER — SUGAMMADEX SODIUM 200 MG/2ML IV SOLN
INTRAVENOUS | Status: DC | PRN
  Administered 2024-05-30: 200 mg via INTRAVENOUS

## 2024-05-30 MED ORDER — ORAL CARE MOUTH RINSE: 15.0000 mL | Freq: Once | OROMUCOSAL | Status: AC

## 2024-05-30 MED ORDER — FENTANYL CITRATE (PF) 250 MCG/5ML IJ SOLN
INTRAMUSCULAR | Status: DC | PRN
  Administered 2024-05-30: 150 ug via INTRAVENOUS
  Administered 2024-05-30 (×2): 50 ug via INTRAVENOUS

## 2024-05-30 MED ORDER — EPHEDRINE SULFATE-NACL 50-0.9 MG/10ML-% IV SOSY
PREFILLED_SYRINGE | INTRAVENOUS | Status: DC | PRN
  Administered 2024-05-30: 10 mg via INTRAVENOUS

## 2024-05-30 MED ORDER — AMISULPRIDE (ANTIEMETIC) 5 MG/2ML IV SOLN
10.0000 mg | Freq: Once | INTRAVENOUS | Status: AC | PRN
  Administered 2024-05-30: 10 mg via INTRAVENOUS

## 2024-05-30 MED ORDER — DEXAMETHASONE SODIUM PHOSPHATE 10 MG/ML IJ SOLN
INTRAMUSCULAR | Status: DC | PRN
  Administered 2024-05-30: 8 mg via INTRAVENOUS

## 2024-05-30 MED ORDER — HYDROMORPHONE HCL 1 MG/ML IJ SOLN
0.2500 mg | INTRAMUSCULAR | Status: DC | PRN
  Administered 2024-05-30: 0.25 mg via INTRAVENOUS
  Administered 2024-05-30: 0.5 mg via INTRAVENOUS

## 2024-05-30 MED ORDER — MIDAZOLAM HCL 2 MG/2ML IJ SOLN
INTRAMUSCULAR | Status: AC
Start: 2024-05-30 — End: 2024-05-30
  Filled 2024-05-30: qty 2

## 2024-05-30 MED ORDER — ONDANSETRON HCL 4 MG/2ML IJ SOLN
4.0000 mg | Freq: Once | INTRAMUSCULAR | Status: AC
  Administered 2024-05-30: 4 mg via INTRAVENOUS
  Filled 2024-05-30: qty 2

## 2024-05-30 MED ORDER — ROCURONIUM BROMIDE 10 MG/ML (PF) SYRINGE
PREFILLED_SYRINGE | INTRAVENOUS | Status: DC | PRN
  Administered 2024-05-30: 70 mg via INTRAVENOUS

## 2024-05-30 MED ORDER — MORPHINE SULFATE (PF) 4 MG/ML IV SOLN
4.0000 mg | Freq: Once | INTRAVENOUS | Status: AC
  Administered 2024-05-30: 4 mg via INTRAVENOUS
  Filled 2024-05-30: qty 1

## 2024-05-30 MED ORDER — ONDANSETRON HCL 4 MG/2ML IJ SOLN: 4.0000 mg | Freq: Once | INTRAMUSCULAR | Status: AC

## 2024-05-30 MED ORDER — CHLORHEXIDINE GLUCONATE 0.12 % MT SOLN
OROMUCOSAL | Status: AC
  Administered 2024-05-30: 15 mL via OROMUCOSAL
  Filled 2024-05-30: qty 15

## 2024-05-30 MED ORDER — CHLORHEXIDINE GLUCONATE 0.12 % MT SOLN: 15.0000 mL | Freq: Once | OROMUCOSAL | Status: AC

## 2024-05-30 MED ORDER — FENTANYL CITRATE (PF) 250 MCG/5ML IJ SOLN
INTRAMUSCULAR | Status: AC
  Filled 2024-05-30: qty 5

## 2024-05-30 MED ORDER — BUPIVACAINE-EPINEPHRINE 0.25% -1:200000 IJ SOLN
INTRAMUSCULAR | Status: DC | PRN
  Administered 2024-05-30: 20 mL

## 2024-05-30 MED ORDER — PROPOFOL 10 MG/ML IV BOLUS
INTRAVENOUS | Status: DC | PRN
  Administered 2024-05-30: 200 mg via INTRAVENOUS

## 2024-05-30 MED ORDER — HYDROMORPHONE HCL 1 MG/ML IJ SOLN
INTRAMUSCULAR | Status: AC
  Filled 2024-05-30: qty 1

## 2024-05-30 SURGICAL SUPPLY — 28 items
BAG COUNTER SPONGE SURGICOUNT (BAG) ×2 IMPLANT
CANISTER SUCTION 3000ML PPV (SUCTIONS) ×2 IMPLANT
CHLORAPREP W/TINT 26 (MISCELLANEOUS) ×2 IMPLANT
CLIP APPLIE 5 13 M/L LIGAMAX5 (MISCELLANEOUS) ×2 IMPLANT
COVER SURGICAL LIGHT HANDLE (MISCELLANEOUS) ×2 IMPLANT
DERMABOND ADVANCED .7 DNX12 (GAUZE/BANDAGES/DRESSINGS) ×2 IMPLANT
ELECTRODE REM PT RTRN 9FT ADLT (ELECTROSURGICAL) ×2 IMPLANT
GLOVE SURG SIGNA 7.5 PF LTX (GLOVE) ×2 IMPLANT
GOWN STRL REUS W/ TWL LRG LVL3 (GOWN DISPOSABLE) ×4 IMPLANT
GOWN STRL REUS W/ TWL XL LVL3 (GOWN DISPOSABLE) ×2 IMPLANT
IRRIGATION SUCT STRKRFLW 2 WTP (MISCELLANEOUS) ×2 IMPLANT
KIT BASIN OR (CUSTOM PROCEDURE TRAY) ×2 IMPLANT
KIT TURNOVER KIT B (KITS) ×2 IMPLANT
NS IRRIG 1000ML POUR BTL (IV SOLUTION) ×2 IMPLANT
PAD ARMBOARD POSITIONER FOAM (MISCELLANEOUS) ×2 IMPLANT
SCISSORS LAP 5X35 DISP (ENDOMECHANICALS) ×2 IMPLANT
SET TUBE SMOKE EVAC HIGH FLOW (TUBING) ×2 IMPLANT
SLEEVE Z-THREAD 5X100MM (TROCAR) ×4 IMPLANT
SPECIMEN JAR SMALL (MISCELLANEOUS) ×2 IMPLANT
SUT MNCRL AB 4-0 PS2 18 (SUTURE) ×2 IMPLANT
SYSTEM BAG RETRIEVAL 10MM (BASKET) ×2 IMPLANT
TOWEL GREEN STERILE (TOWEL DISPOSABLE) ×2 IMPLANT
TOWEL GREEN STERILE FF (TOWEL DISPOSABLE) ×2 IMPLANT
TRAY LAPAROSCOPIC MC (CUSTOM PROCEDURE TRAY) ×2 IMPLANT
TROCAR BALLN 12MMX100 BLUNT (TROCAR) ×2 IMPLANT
TROCAR Z-THREAD OPTICAL 5X100M (TROCAR) ×2 IMPLANT
WARMER LAPAROSCOPE (MISCELLANEOUS) ×2 IMPLANT
WATER STERILE IRR 1000ML POUR (IV SOLUTION) ×2 IMPLANT

## 2024-05-30 NOTE — ED Provider Notes (Signed)
 Care assumed from PA Ubaldo High at shift change, please see his note for full details, but in brief Eugene Butler is a 43 y.o. male  Had an outpatient HIDA scan that showed biliary dyskinesia.  Presents today due to persistent and worsening upper abdominal pain.  He reports some associated nausea without vomiting.  He was going to wait to try and see the surgeon but pain became more severe.    Previous provider consulted general surgery and discussed with Dr. Ann who recommended getting ultrasound plus or minus CT scan to assess for potential acute cholecystitis and then will discuss again with surgery.  At shift change right upper quadrant ultrasound pending if this is unremarkable consider CT to see if there is external mass compressing gallbladder and then will discuss with surgery.  BP 111/71 (BP Location: Right Arm)   Pulse 66   Temp 98.1 F (36.7 C)   Resp 12   Ht 6' 1 (1.854 m)   Wt 135.4 kg   SpO2 100%   BMI 39.38 kg/m    Procedures  Procedures  ED Course / MDM    Labs Reviewed  COMPREHENSIVE METABOLIC PANEL WITH GFR - Abnormal; Notable for the following components:      Result Value   CO2 21 (*)    Glucose, Bld 107 (*)    All other components within normal limits  I-STAT CHEM 8, ED - Abnormal; Notable for the following components:   Glucose, Bld 108 (*)    TCO2 21 (*)    All other components within normal limits  LIPASE, BLOOD  CBC  URINALYSIS, ROUTINE W REFLEX MICROSCOPIC    CT ABDOMEN PELVIS W CONTRAST Result Date: 05/30/2024 EXAM: CT ABDOMEN AND PELVIS WITH CONTRAST 05/30/2024 07:53:11 AM TECHNIQUE: CT of the abdomen and pelvis was performed with the administration of intravenous contrast. Multiplanar reformatted images are provided for review. Automated exposure control, iterative reconstruction, and/or weight-based adjustment of the mA/kV was utilized to reduce the radiation dose to as low as reasonably achievable. COMPARISON: Abdominal sonogram from  05/30/2024 and CT abdomen and pelvis from 07/08/2022. CLINICAL HISTORY: Cholelithiasis; GB compression and hypokinesis assess for surrounding mass. Pt in with upper abdominal pain onset yesterday morning, states constant nausea. States he had a NM Hepato procedure yesterday. Also states he takes Wegovy , last dose Friday, but these symptoms have been going on for months prior to this. Burning pain; does also radiate into his chest. FINDINGS: LOWER CHEST: No acute abnormality. LIVER: The liver is unremarkable. GALLBLADDER AND BILE DUCTS: Gallbladder is unremarkable. No biliary ductal dilatation. SPLEEN: No acute abnormality. PANCREAS: No acute abnormality. ADRENAL GLANDS: No acute abnormality. KIDNEYS, URETERS AND BLADDER: No stones in the kidneys or ureters. No hydronephrosis. No perinephric or periureteral stranding. Urinary bladder is unremarkable. GI AND BOWEL: Stomach demonstrates no acute abnormality. There is no bowel obstruction. Status post appendectomy PERITONEUM AND RETROPERITONEUM: No ascites. No free air. VASCULATURE: Aorta is normal in caliber. LYMPH NODES: No lymphadenopathy. REPRODUCTIVE ORGANS: Normal prostate gland. BONES AND SOFT TISSUES: No acute osseous abnormality. No focal soft tissue abnormality. IMPRESSION: 1. No acute findings in the abdomen or pelvis. No findings to explain the patient's abdominal pain. Electronically signed by: Waddell Calk MD 05/30/2024 08:10 AM EDT RP Workstation: [id number]   US  Abdomen Limited RUQ (LIVER/GB) Result Date: 05/30/2024 EXAM: Right Upper Quadrant Abdominal Ultrasound TECHNIQUE: Real-time ultrasonography of the right upper quadrant of the abdomen was performed. COMPARISON: Nuclear medicine hepatobiliary scan 05/29/2024. CLINICAL HISTORY: RUQ pain.  FINDINGS: LIVER: The liver demonstrates normal echogenicity. No intrahepatic biliary ductal dilatation. No mass. The portal vein is patent with normal directional flow towards the liver. BILIARY SYSTEM:  Partially decompressed gallbladder. Gallbladder wall thickness is upper limits of normal measuring 2.9 mm. No cholelithiasis. Negative sonographic Murphy's sign. Common bile duct is within normal limits measuring 3.9 mm. OTHER: No right upper quadrant ascites. IMPRESSION: 1. Partially decompressed gallbladder with wall thickness at the upper limits of normal (2.9 mm). The patient was not NPO prior to the exam . 2. No gallstones, pericholecystic fluid, or sonographic Murphy sign. Electronically signed by: Waddell Calk MD 05/30/2024 07:03 AM EDT RP Workstation: [id number]   NM Hepato W/Eject Fract Result Date: 05/29/2024 CLINICAL DATA:  Abdominal pain, upper, chronic, assess gallbladder motility. EXAM: NUCLEAR MEDICINE HEPATOBILIARY IMAGING WITH GALLBLADDER EF TECHNIQUE: Sequential images of the abdomen were obtained out to 60 minutes following intravenous administration of radiopharmaceutical. After oral ingestion of Ensure, gallbladder ejection fraction was determined. At 60 min, normal ejection fraction is greater than 33%. RADIOPHARMACEUTICALS:  5.2 mCi Tc-89m  Choletec  IV COMPARISON:  CT July 08 2022 FINDINGS: Prompt uptake and biliary excretion of activity by the liver is seen. Gallbladder activity is visualized, consistent with patency of cystic duct. Biliary activity passes into small bowel, consistent with patent common bile duct. Calculated gallbladder ejection fraction is 18%. (Normal gallbladder ejection fraction with Ensure is greater than 33% and less than 80%.) IMPRESSION: Reduced gallbladder ejection fraction as can be seen with chronic cholecystitis/biliary dyskinesia. Electronically Signed   By: Reyes Holder M.D.   On: 05/29/2024 16:47    Medical Decision Making Amount and/or Complexity of Data Reviewed Radiology: ordered.  Risk Prescription drug management. Decision regarding hospitalization.   Right upper quadrant ultrasound showed a partially decompressed gallbladder with wall  thickness at the upper limits of normal with no evidence of gallstones or Perry cholecystic fluid.  CT abdomen pelvis obtained given appearance of compressed gallbladder to assess for external mass potentially pushing on the gallbladder or affecting its ability to contract.  CT of the abdomen pelvis viewed and interpreted without obvious mass or other acute findings to explain patient's symptoms.  PA Vertell Pringle with general surgery has been by to see and speak with the patient, they plan to offer him cholecystectomy today.  Patient will be admitted to surgery service.       Alva Larraine FALCON, PA-C 05/30/24 [id number]    Cottie Donnice PARAS, MD 05/30/24 1018

## 2024-05-30 NOTE — Discharge Instructions (Signed)

## 2024-05-30 NOTE — Op Note (Signed)
 Laparoscopic Cholecystectomy Procedure Note  Indications: This patient presents with symptomatic gallbladder biliary dyskinesia and will undergo laparoscopic cholecystectomy.  Pre-operative Diagnosis: biliary dyskinesia  Post-operative Diagnosis: same  Surgeon: Vicenta Poli   Assistants: none  Anesthesia: General endotracheal anesthesia  ASA Class: 2  Procedure Details  The patient was seen again in the Holding Room. The risks, benefits, complications, treatment options, and expected outcomes were discussed with the patient. The possibilities of reaction to medication, pulmonary aspiration, perforation of viscus, bleeding, recurrent infection, finding a normal gallbladder, the need for additional procedures, failure to diagnose a condition, the possible need to convert to an open procedure, and creating a complication requiring transfusion or operation were discussed with the patient. The likelihood of improving the patient's symptoms with return to their baseline status is good.  The patient and/or family concurred with the proposed plan, giving informed consent. The site of surgery properly noted. The patient was taken to Operating Room, identified as [facility] and the procedure verified as Laparoscopic Cholecystectomy with Intraoperative Cholangiogram. A Time Out was held and the above information confirmed.  Prior to the induction of general anesthesia, antibiotic prophylaxis was administered. General endotracheal anesthesia was then administered and tolerated well. After the induction, the abdomen was prepped with Chloraprep and draped in sterile fashion. The patient was positioned in the supine position.  Local anesthetic agent was injected into the skin near the umbilicus and an incision made. We dissected down to the abdominal fascia with blunt dissection.  The fascia was incised vertically and we entered the peritoneal cavity bluntly.  A pursestring suture of 0-Vicryl was  placed around the fascial opening.  The Hasson cannula was inserted and secured with the stay suture.  Pneumoperitoneum was then created with CO2 and tolerated well without any adverse changes in the patient's vital signs. A 5-mm port was placed in the subxiphoid position.  Two 5-mm ports were placed in the right upper quadrant. All ports were placed under direst vision.  All skin incisions were infiltrated with a local anesthetic agent before making the incision and placing the trocars.   We positioned the patient in reverse Trendelenburg, tilted slightly to the patient's left.  The gallbladder was identified and was mildly thick walled. The  fundus was grasped and retracted cephalad. Adhesions were lysed bluntly and with the electrocautery , taking care not to injure any adjacent organs or viscus. The infundibulum was grasped and retracted laterally, exposing the peritoneum overlying the triangle of Calot. This was then divided and exposed in a blunt fashion. The cystic duct was clearly identified and bluntly dissected circumferentially. A critical view of the cystic duct and cystic artery was obtained.  The cystic duct was then ligated with clips and divided. The cystic artery was, dissected free, ligated with clips and divided as well.   The gallbladder was dissected from the liver bed in retrograde fashion with the electrocautery. The gallbladder was removed and placed in an Endocatch sac. The liver bed was irrigated and inspected. Hemostasis was achieved with the electrocautery. Copious irrigation was utilized and was repeatedly aspirated until clear.  The gallbladder and Endocatch sac were then removed through the umbilical port site.  The pursestring suture was used to close the umbilical fascia.    We again inspected the right upper quadrant for hemostasis.  Pneumoperitoneum was released as we removed the trocars.  4-0 Monocryl was used to close the skin.   Skin glue was then applied. The patient was  then extubated  and brought to the recovery room in stable condition. Instrument, sponge, and needle counts were correct at closure and at the conclusion of the case.   Findings: Mild Cholecystitis without Cholelithiasis  Estimated Blood Loss: Minimal         Drains: none         Specimens: Gallbladder           Complications: None; patient tolerated the procedure well.         Disposition: PACU - hemodynamically stable.         Condition: stable

## 2024-05-30 NOTE — ED Triage Notes (Addendum)
 Pt in with upper abdominal pain onset yesterday morning, states constant nausea. States he had a NM Hepato procedure yestrday. Also states he takes Wegovy , last dose Friday, but these symptoms have been going on for months prior to this. Burning pain does also radiate into his chest

## 2024-05-30 NOTE — Anesthesia Preprocedure Evaluation (Addendum)
 Anesthesia Evaluation  Patient identified by MRN, date of birth, ID band Patient awake    Reviewed: Allergy & Precautions, H&P , NPO status , Patient's Chart, lab work & pertinent test results  Airway Mallampati: II  TM Distance: >3 FB Neck ROM: Full    Dental no notable dental hx. (+) Teeth Intact, Dental Advisory Given   Pulmonary neg pulmonary ROS, sleep apnea    Pulmonary exam normal breath sounds clear to auscultation       Cardiovascular Exercise Tolerance: Good negative cardio ROS Normal cardiovascular exam Rhythm:Regular Rate:Normal     Neuro/Psych  Neuromuscular disease negative neurological ROS  negative psych ROS   GI/Hepatic negative GI ROS, Neg liver ROS,GERD  Medicated,,  Endo/Other  negative endocrine ROSHypothyroidism  Class 4 obesity  Renal/GU negative Renal ROS  negative genitourinary   Musculoskeletal negative musculoskeletal ROS (+)    Abdominal   Peds negative pediatric ROS (+)  Hematology negative hematology ROS (+)   Anesthesia Other Findings   Reproductive/Obstetrics negative OB ROS                              Anesthesia Physical Anesthesia Plan  ASA: 2 and emergent  Anesthesia Plan: General   Post-op Pain Management: Ofirmev  IV (intra-op)*, Dilaudid  IV and Precedex   Induction: Intravenous and Cricoid pressure planned  PONV Risk Score and Plan: 2 and Ondansetron , Dexamethasone  and Treatment may vary due to age or medical condition  Airway Management Planned: Oral ETT  Additional Equipment: None  Intra-op Plan:   Post-operative Plan: Extubation in OR  Informed Consent: I have reviewed the patients History and Physical, chart, labs and discussed the procedure including the risks, benefits and alternatives for the proposed anesthesia with the patient or authorized representative who has indicated his/her understanding and acceptance.       Plan  Discussed with: Anesthesiologist and CRNA  Anesthesia Plan Comments: (  )         Anesthesia Quick Evaluation

## 2024-05-30 NOTE — Consult Note (Deleted)
 Eugene Butler 10/07/1980  [id number].    Requesting MD: Cottie, MD Chief Complaint/Reason for Consult: possible symptomatic gallbladder disease   HPI:  Eugene Butler is a 43 y/o M with PMH appendectomy 2021 and obesity who presents with worsening abdominal pain. He reports a couple of weeks of RUQ and mid abdominal pain that is worse after PO intake and worse when he is sitting up. This is associated with acid reflux and intermittent vomiting. He tells me that usually vomiting makes him feel better but yesterday his pain became much worse, he vomited, and the pain did not ease off. Other associated sxs include chills and diarrhea. His significant other at the bedside adds that he has had symptoms for years but they have been much worse lately. He denies the use of NSAIDs or a known history of gastric ulcers. He denies daily medication use, sometimes uses tums or pepto bismol. He denies tobacco use. 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 He denies a history of opioid dependence but has taken them after his appendectomy/achilles surgery and does not like the way they make him feel.   ROS: Review of Systems  All other systems reviewed and are negative.   Family History  Problem Relation Age of Onset   Arthritis Mother    Lung cancer Mother    Early death Mother    Kidney disease Brother     Past Medical History:  Diagnosis Date   Cervical radiculopathy    Dr. Arnaldo   Fatty liver    u/s 05/2022   GERD (gastroesophageal reflux disease)    History of meningitis    childhood.  Question of seizures related to this, was on tegretol for a while per pt report   Hypothyroidism    Patient reports he was on levothyroxine until he self DC'd this medication the age of 74   Left shoulder pain    Dr. Arnaldo   Morbid obesity (HCC)    OSA (obstructive sleep apnea)    Skin of: Did home sleep study at 1 point but says he was never called about the results   Recurrent  abdominal pain    Stress fracture of navicular bone of foot     Past Surgical History:  Procedure Laterality Date   ACHILLES TENDON SURGERY     APPENDECTOMY  01/02/2020    Social History:  reports that he has never smoked. His smokeless tobacco use includes snuff. He reports current alcohol use. He reports that he does not use drugs.  Allergies:  Allergies  Allergen Reactions   Naloxone Anaphylaxis   Pertussis Vaccines     (Not in a hospital admission)    Physical Exam: Blood pressure 111/75, pulse 85, temperature 98 F (36.7 C), temperature source Oral, resp. rate 17, height 6' 1 (1.854 m), weight 135.4 kg, SpO2 100%. General: Pleasant white male laying on hospital bed, appears stated age, NAD. HEENT: head -normocephalic, atraumatic; Eyes: PERRLA, no conjunctival injection; Ears- no external lesions or tenderness Neck- Trachea is midline CV- RRR, no lower extremity edema  Pulm- breathing is non-labored ORA Abd- soft, non distended, he is globally tender but is most tender in the RUQ, no palpable hernias.  GU- deferred  MSK- UE/LE symmetrical, no cyanosis, clubbing, or edema. Neuro- CN II-XII grossly in tact, no paresthesias. Psych- Alert and Oriented x3 with appropriate affect Skin: warm and dry, no rashes or lesions   Results for orders placed or  performed during the hospital encounter of 05/30/24 (from the past 48 hours)  Urinalysis, Routine w reflex microscopic -Urine, Clean Catch     Status: None   Collection Time: 05/30/24  2:07 AM  Result Value Ref Range   Color, Urine YELLOW YELLOW   APPearance CLEAR CLEAR   Specific Gravity, Urine 1.026 1.005 - 1.030   pH 5.0 5.0 - 8.0   Glucose, UA NEGATIVE NEGATIVE mg/dL   Hgb urine dipstick NEGATIVE NEGATIVE   Bilirubin Urine NEGATIVE NEGATIVE   Ketones, ur NEGATIVE NEGATIVE mg/dL   Protein, ur NEGATIVE NEGATIVE mg/dL   Nitrite NEGATIVE NEGATIVE   Leukocytes,Ua NEGATIVE NEGATIVE    Comment: Performed at [facility], [address] [place], [place] [address]  Lipase, blood     Status: None   Collection Time: 05/30/24  2:09 AM  Result Value Ref Range   Lipase 43 11 - 51 U/L    Comment: Performed at [facility], [address] [place], [place] [address]  Comprehensive metabolic panel     Status: Abnormal   Collection Time: 05/30/24  2:09 AM  Result Value Ref Range   Sodium 140 135 - 145 mmol/L   Potassium 4.1 3.5 - 5.1 mmol/L   Chloride 106 98 - 111 mmol/L   CO2 21 (L) 22 - 32 mmol/L   Glucose, Bld 107 (H) 70 - 99 mg/dL    Comment: Glucose reference range applies only to samples taken after fasting for at least 8 hours.   BUN 18 6 - 20 mg/dL   Creatinine, Ser 8.98 0.61 - 1.24 mg/dL   Calcium 9.4 8.9 - 89.6 mg/dL   Total Protein 7.1 6.5 - 8.1 g/dL   Albumin  4.0 3.5 - 5.0 g/dL   AST 20 15 - 41 U/L   ALT 23 0 - 44 U/L   Alkaline Phosphatase 70 38 - 126 U/L   Total Bilirubin 0.6 0.0 - 1.2 mg/dL   GFR, Estimated >39 >39 mL/min    Comment: (NOTE) Calculated using the CKD-EPI Creatinine Equation (2021)    Anion gap 13 5 - 15    Comment: Performed at [facility], [address], [place], [place] [address]  CBC     Status: None   Collection Time: 05/30/24  2:09 AM  Result Value Ref Range   WBC 7.8 4.0 - 10.5 K/uL   RBC 4.90 4.22 - 5.81 MIL/uL   Hemoglobin 14.2 13.0 - 17.0 g/dL   HCT 57.7 60.9 - 47.9 %   MCV 86.1 80.0 - 100.0 fL   MCH 29.0 26.0 - 34.0 pg   MCHC 33.6 30.0 - 36.0 g/dL   RDW 87.1 88.4 - 84.4 %   Platelets 282 150 - 400 K/uL   nRBC 0.0 0.0 - 0.2 %    Comment: Performed at [facility], [address] [place], [place] [address]  I-stat chem 8, ED (not at [facility], [facility] or [facility])     Status: Abnormal   Collection Time: 05/30/24  2:13 AM  Result Value Ref Range   Sodium 140 135 - 145 mmol/L   Potassium 4.0 3.5 - 5.1 mmol/L   Chloride 106 98 - 111 mmol/L   BUN 20 6 - 20 mg/dL   Creatinine, Ser 8.99 0.61 - 1.24 mg/dL   Glucose, Bld 891 (H) 70 - 99 mg/dL     Comment: Glucose reference range applies only to samples taken after fasting for at least 8  hours.   Calcium, Ion 1.15 1.15 - 1.40 mmol/L   TCO2 21 (L) 22 - 32 mmol/L   Hemoglobin 14.3 13.0 - 17.0 g/dL   HCT 57.9 60.9 - 47.9 %   CT ABDOMEN PELVIS W CONTRAST Result Date: 05/30/2024 EXAM: CT ABDOMEN AND PELVIS WITH CONTRAST 05/30/2024 07:53:11 AM TECHNIQUE: CT of the abdomen and pelvis was performed with the administration of intravenous contrast. Multiplanar reformatted images are provided for review. Automated exposure control, iterative reconstruction, and/or weight-based adjustment of the mA/kV was utilized to reduce the radiation dose to as low as reasonably achievable. COMPARISON: Abdominal sonogram from 05/30/2024 and CT abdomen and pelvis from 07/08/2022. CLINICAL HISTORY: Cholelithiasis; GB compression and hypokinesis assess for surrounding mass. Pt in with upper abdominal pain onset yesterday morning, states constant nausea. States he had a NM Hepato procedure yesterday. Also states he takes Wegovy , last dose Friday, but these symptoms have been going on for months prior to this. Burning pain; does also radiate into his chest. FINDINGS: LOWER CHEST: No acute abnormality. LIVER: The liver is unremarkable. GALLBLADDER AND BILE DUCTS: Gallbladder is unremarkable. No biliary ductal dilatation. SPLEEN: No acute abnormality. PANCREAS: No acute abnormality. ADRENAL GLANDS: No acute abnormality. KIDNEYS, URETERS AND BLADDER: No stones in the kidneys or ureters. No hydronephrosis. No perinephric or periureteral stranding. Urinary bladder is unremarkable. GI AND BOWEL: Stomach demonstrates no acute abnormality. There is no bowel obstruction. Status post appendectomy PERITONEUM AND RETROPERITONEUM: No ascites. No free air. VASCULATURE: Aorta is normal in caliber. LYMPH NODES: No lymphadenopathy. REPRODUCTIVE ORGANS: Normal prostate gland. BONES AND SOFT TISSUES: No acute osseous abnormality. No focal soft tissue  abnormality. IMPRESSION: 1. No acute findings in the abdomen or pelvis. No findings to explain the patient's abdominal pain. Electronically signed by: Waddell Calk MD 05/30/2024 08:10 AM EDT RP Workstation: [id number]   US  Abdomen Limited RUQ (LIVER/GB) Result Date: 05/30/2024 EXAM: Right Upper Quadrant Abdominal Ultrasound TECHNIQUE: Real-time ultrasonography of the right upper quadrant of the abdomen was performed. COMPARISON: Nuclear medicine hepatobiliary scan 05/29/2024. CLINICAL HISTORY: RUQ pain. FINDINGS: LIVER: The liver demonstrates normal echogenicity. No intrahepatic biliary ductal dilatation. No mass. The portal vein is patent with normal directional flow towards the liver. BILIARY SYSTEM: Partially decompressed gallbladder. Gallbladder wall thickness is upper limits of normal measuring 2.9 mm. No cholelithiasis. Negative sonographic Murphy's sign. Common bile duct is within normal limits measuring 3.9 mm. OTHER: No right upper quadrant ascites. IMPRESSION: 1. Partially decompressed gallbladder with wall thickness at the upper limits of normal (2.9 mm). The patient was not NPO prior to the exam . 2. No gallstones, pericholecystic fluid, or sonographic Murphy sign. Electronically signed by: Waddell Calk MD 05/30/2024 07:03 AM EDT RP Workstation: [id number]   NM Hepato W/Eject Fract Result Date: 05/29/2024 CLINICAL DATA:  Abdominal pain, upper, chronic, assess gallbladder motility. EXAM: NUCLEAR MEDICINE HEPATOBILIARY IMAGING WITH GALLBLADDER EF TECHNIQUE: Sequential images of the abdomen were obtained out to 60 minutes following intravenous administration of radiopharmaceutical. After oral ingestion of Ensure, gallbladder ejection fraction was determined. At 60 min, normal ejection fraction is greater than 33%. RADIOPHARMACEUTICALS:  5.2 mCi Tc-58m  Choletec  IV COMPARISON:  CT July 08 2022 FINDINGS: Prompt uptake and biliary excretion of activity by the liver is seen. Gallbladder activity is  visualized, consistent with patency of cystic duct. Biliary activity passes into small bowel, consistent with patent common bile duct. Calculated gallbladder ejection fraction is 18%. (Normal gallbladder ejection fraction with Ensure is greater than 33% and less than 80%.) IMPRESSION: Reduced  gallbladder ejection fraction as can be seen with chronic cholecystitis/biliary dyskinesia. Electronically Signed   By: Reyes Holder M.D.   On: 05/29/2024 16:47   Assessment/Plan 43 y/o M with progressive RUQ pain associated with vomiting  HIDA yesterday by his PCP shows patent cystic duct, GB EF of 18%  RUQ U/S negative for gallstones or pericholecystic fluid  CT abdomen and pelvic negative for acute findings  He is afebrile, hemodynamically stable, with normal CBC and LFTs.   While it is atypical for a young male, without gallstones, I do suspect his gallbladder disease, potentially biliary dyskinesia, is the source of his symptoms. While there is no emergent nature to surgery, I think he would benefit from laparoscopic cholecystectomy. Will discuss with my attending but likely cholecystectomy today, possible discharge from PACU.    I reviewed nursing notes, ED provider notes, last 24 h vitals and pain scores, last 48 h intake and output, last 24 h labs and trends, and last 24 h imaging results.  Almarie GORMAN Pringle, [facility] 05/30/2024, 9:01 AM Please see Amion for pager number during day hours 7:00am-4:30pm or 7:00am -11:30am on weekends

## 2024-05-30 NOTE — Anesthesia Procedure Notes (Signed)
 Procedure Name: Intubation Date/Time: 05/30/2024 1:46 PM  Performed by: Lockie Flesher, CRNAPre-anesthesia Checklist: Patient identified, Emergency Drugs available, Suction available and Patient being monitored Patient Re-evaluated:Patient Re-evaluated prior to induction Oxygen Delivery Method: Circle System Utilized Preoxygenation: Pre-oxygenation with 100% oxygen Induction Type: IV induction Ventilation: Mask ventilation without difficulty Laryngoscope Size: Mac and 3 Grade View: Grade I Tube type: Oral Tube size: 7.0 mm Number of attempts: 1 Airway Equipment and Method: Stylet and Oral airway Placement Confirmation: ETT inserted through vocal cords under direct vision, positive ETCO2 and breath sounds checked- equal and bilateral Secured at: 23 cm Tube secured with: Tape Dental Injury: Teeth and Oropharynx as per pre-operative assessment

## 2024-05-30 NOTE — ED Notes (Signed)
 Patient transported to Ultrasound

## 2024-05-30 NOTE — Anesthesia Postprocedure Evaluation (Signed)
 Anesthesia Post Note  Patient: Eugene Butler  Procedure(s) Performed: LAPAROSCOPIC CHOLECYSTECTOMY (Abdomen)     Patient location during evaluation: PACU Anesthesia Type: General Level of consciousness: awake and alert Pain management: pain level controlled Vital Signs Assessment: post-procedure vital signs reviewed and stable Respiratory status: spontaneous breathing, nonlabored ventilation, respiratory function stable and patient connected to nasal cannula oxygen Cardiovascular status: blood pressure returned to baseline and stable Postop Assessment: no apparent nausea or vomiting Anesthetic complications: no   No notable events documented.  Last Vitals:  Vitals:   05/30/24 1545 05/30/24 1600  BP: 121/73 117/71  Pulse: 61 68  Resp: 20 18  Temp:  36.6 C  SpO2: 93% 93%    Last Pain:  Vitals:   05/30/24 1452  TempSrc:   PainSc: 10-Worst pain ever                 Kortland Nichols

## 2024-05-30 NOTE — Transfer of Care (Signed)
 Immediate Anesthesia Transfer of Care Note  Patient: Eugene Butler  Procedure(s) Performed: LAPAROSCOPIC CHOLECYSTECTOMY (Abdomen)  Patient Location: PACU  Anesthesia Type:General  Level of Consciousness: awake, alert , and oriented  Airway & Oxygen Therapy: Patient Spontanous Breathing and Patient connected to nasal cannula oxygen  Post-op Assessment: Report given to RN and Post -op Vital signs reviewed and stable  Post vital signs: Reviewed and stable  Last Vitals:  Vitals Value Taken Time  BP 145/108 05/30/24 14:32  Temp    Pulse 73 05/30/24 14:35  Resp 22 05/30/24 14:35  SpO2 90 % 05/30/24 14:35  Vitals shown include unfiled device data.  Last Pain:  Vitals:   05/30/24 1241  TempSrc:   PainSc: 4          Complications: No notable events documented.

## 2024-05-30 NOTE — H&P (Signed)
 Eugene Butler 10/07/1980  [id number].    Requesting MD: Cottie, MD Chief Complaint/Reason for Consult: possible symptomatic gallbladder disease   HPI:  Eugene Butler is a 43 y/o M with PMH appendectomy 2021 and obesity who presents with worsening abdominal pain. He reports a couple of weeks of RUQ and mid abdominal pain that is worse after PO intake and worse when he is sitting up. This is associated with acid reflux and intermittent vomiting. He tells me that usually vomiting makes him feel better but yesterday his pain became much worse, he vomited, and the pain did not ease off. Other associated sxs include chills and diarrhea. His significant other at the bedside adds that he has had symptoms for years but they have been much worse lately. He denies the use of NSAIDs or a known history of gastric ulcers. He denies daily medication use, sometimes uses tums or pepto bismol. He denies tobacco use. 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 He denies a history of opioid dependence but has taken them after his appendectomy/achilles surgery and does not like the way they make him feel.   ROS: Review of Systems  All other systems reviewed and are negative.   Family History  Problem Relation Age of Onset   Arthritis Mother    Lung cancer Mother    Early death Mother    Kidney disease Brother     Past Medical History:  Diagnosis Date   Cervical radiculopathy    Dr. Arnaldo   Fatty liver    u/s 05/2022   GERD (gastroesophageal reflux disease)    History of meningitis    childhood.  Question of seizures related to this, was on tegretol for a while per pt report   Hypothyroidism    Patient reports he was on levothyroxine until he self DC'd this medication the age of 74   Left shoulder pain    Dr. Arnaldo   Morbid obesity (HCC)    OSA (obstructive sleep apnea)    Skin of: Did home sleep study at 1 point but says he was never called about the results   Recurrent  abdominal pain    Stress fracture of navicular bone of foot     Past Surgical History:  Procedure Laterality Date   ACHILLES TENDON SURGERY     APPENDECTOMY  01/02/2020    Social History:  reports that he has never smoked. His smokeless tobacco use includes snuff. He reports current alcohol use. He reports that he does not use drugs.  Allergies:  Allergies  Allergen Reactions   Naloxone Anaphylaxis   Pertussis Vaccines     (Not in a hospital admission)    Physical Exam: Blood pressure 111/75, pulse 85, temperature 98 F (36.7 C), temperature source Oral, resp. rate 17, height 6' 1 (1.854 m), weight 135.4 kg, SpO2 100%. General: Pleasant white male laying on hospital bed, appears stated age, NAD. HEENT: head -normocephalic, atraumatic; Eyes: PERRLA, no conjunctival injection; Ears- no external lesions or tenderness Neck- Trachea is midline CV- RRR, no lower extremity edema  Pulm- breathing is non-labored ORA Abd- soft, non distended, he is globally tender but is most tender in the RUQ, no palpable hernias.  GU- deferred  MSK- UE/LE symmetrical, no cyanosis, clubbing, or edema. Neuro- CN II-XII grossly in tact, no paresthesias. Psych- Alert and Oriented x3 with appropriate affect Skin: warm and dry, no rashes or lesions   Results for orders placed or  performed during the hospital encounter of 05/30/24 (from the past 48 hours)  Urinalysis, Routine w reflex microscopic -Urine, Clean Catch     Status: None   Collection Time: 05/30/24  2:07 AM  Result Value Ref Range   Color, Urine YELLOW YELLOW   APPearance CLEAR CLEAR   Specific Gravity, Urine 1.026 1.005 - 1.030   pH 5.0 5.0 - 8.0   Glucose, UA NEGATIVE NEGATIVE mg/dL   Hgb urine dipstick NEGATIVE NEGATIVE   Bilirubin Urine NEGATIVE NEGATIVE   Ketones, ur NEGATIVE NEGATIVE mg/dL   Protein, ur NEGATIVE NEGATIVE mg/dL   Nitrite NEGATIVE NEGATIVE   Leukocytes,Ua NEGATIVE NEGATIVE    Comment: Performed at [facility], [address] [place], [place] [address]  Lipase, blood     Status: None   Collection Time: 05/30/24  2:09 AM  Result Value Ref Range   Lipase 43 11 - 51 U/L    Comment: Performed at [facility], [address] [place], [place] [address]  Comprehensive metabolic panel     Status: Abnormal   Collection Time: 05/30/24  2:09 AM  Result Value Ref Range   Sodium 140 135 - 145 mmol/L   Potassium 4.1 3.5 - 5.1 mmol/L   Chloride 106 98 - 111 mmol/L   CO2 21 (L) 22 - 32 mmol/L   Glucose, Bld 107 (H) 70 - 99 mg/dL    Comment: Glucose reference range applies only to samples taken after fasting for at least 8 hours.   BUN 18 6 - 20 mg/dL   Creatinine, Ser 8.98 0.61 - 1.24 mg/dL   Calcium 9.4 8.9 - 89.6 mg/dL   Total Protein 7.1 6.5 - 8.1 g/dL   Albumin  4.0 3.5 - 5.0 g/dL   AST 20 15 - 41 U/L   ALT 23 0 - 44 U/L   Alkaline Phosphatase 70 38 - 126 U/L   Total Bilirubin 0.6 0.0 - 1.2 mg/dL   GFR, Estimated >39 >39 mL/min    Comment: (NOTE) Calculated using the CKD-EPI Creatinine Equation (2021)    Anion gap 13 5 - 15    Comment: Performed at [facility], [address], [place], [place] [address]  CBC     Status: None   Collection Time: 05/30/24  2:09 AM  Result Value Ref Range   WBC 7.8 4.0 - 10.5 K/uL   RBC 4.90 4.22 - 5.81 MIL/uL   Hemoglobin 14.2 13.0 - 17.0 g/dL   HCT 57.7 60.9 - 47.9 %   MCV 86.1 80.0 - 100.0 fL   MCH 29.0 26.0 - 34.0 pg   MCHC 33.6 30.0 - 36.0 g/dL   RDW 87.1 88.4 - 84.4 %   Platelets 282 150 - 400 K/uL   nRBC 0.0 0.0 - 0.2 %    Comment: Performed at [facility], [address] [place], [place] [address]  I-stat chem 8, ED (not at [facility], [facility] or [facility])     Status: Abnormal   Collection Time: 05/30/24  2:13 AM  Result Value Ref Range   Sodium 140 135 - 145 mmol/L   Potassium 4.0 3.5 - 5.1 mmol/L   Chloride 106 98 - 111 mmol/L   BUN 20 6 - 20 mg/dL   Creatinine, Ser 8.99 0.61 - 1.24 mg/dL   Glucose, Bld 891 (H) 70 - 99 mg/dL     Comment: Glucose reference range applies only to samples taken after fasting for at least 8  hours.   Calcium, Ion 1.15 1.15 - 1.40 mmol/L   TCO2 21 (L) 22 - 32 mmol/L   Hemoglobin 14.3 13.0 - 17.0 g/dL   HCT 57.9 60.9 - 47.9 %   CT ABDOMEN PELVIS W CONTRAST Result Date: 05/30/2024 EXAM: CT ABDOMEN AND PELVIS WITH CONTRAST 05/30/2024 07:53:11 AM TECHNIQUE: CT of the abdomen and pelvis was performed with the administration of intravenous contrast. Multiplanar reformatted images are provided for review. Automated exposure control, iterative reconstruction, and/or weight-based adjustment of the mA/kV was utilized to reduce the radiation dose to as low as reasonably achievable. COMPARISON: Abdominal sonogram from 05/30/2024 and CT abdomen and pelvis from 07/08/2022. CLINICAL HISTORY: Cholelithiasis; GB compression and hypokinesis assess for surrounding mass. Pt in with upper abdominal pain onset yesterday morning, states constant nausea. States he had a NM Hepato procedure yesterday. Also states he takes Wegovy , last dose Friday, but these symptoms have been going on for months prior to this. Burning pain; does also radiate into his chest. FINDINGS: LOWER CHEST: No acute abnormality. LIVER: The liver is unremarkable. GALLBLADDER AND BILE DUCTS: Gallbladder is unremarkable. No biliary ductal dilatation. SPLEEN: No acute abnormality. PANCREAS: No acute abnormality. ADRENAL GLANDS: No acute abnormality. KIDNEYS, URETERS AND BLADDER: No stones in the kidneys or ureters. No hydronephrosis. No perinephric or periureteral stranding. Urinary bladder is unremarkable. GI AND BOWEL: Stomach demonstrates no acute abnormality. There is no bowel obstruction. Status post appendectomy PERITONEUM AND RETROPERITONEUM: No ascites. No free air. VASCULATURE: Aorta is normal in caliber. LYMPH NODES: No lymphadenopathy. REPRODUCTIVE ORGANS: Normal prostate gland. BONES AND SOFT TISSUES: No acute osseous abnormality. No focal soft tissue  abnormality. IMPRESSION: 1. No acute findings in the abdomen or pelvis. No findings to explain the patient's abdominal pain. Electronically signed by: Waddell Calk MD 05/30/2024 08:10 AM EDT RP Workstation: [id number]   US  Abdomen Limited RUQ (LIVER/GB) Result Date: 05/30/2024 EXAM: Right Upper Quadrant Abdominal Ultrasound TECHNIQUE: Real-time ultrasonography of the right upper quadrant of the abdomen was performed. COMPARISON: Nuclear medicine hepatobiliary scan 05/29/2024. CLINICAL HISTORY: RUQ pain. FINDINGS: LIVER: The liver demonstrates normal echogenicity. No intrahepatic biliary ductal dilatation. No mass. The portal vein is patent with normal directional flow towards the liver. BILIARY SYSTEM: Partially decompressed gallbladder. Gallbladder wall thickness is upper limits of normal measuring 2.9 mm. No cholelithiasis. Negative sonographic Murphy's sign. Common bile duct is within normal limits measuring 3.9 mm. OTHER: No right upper quadrant ascites. IMPRESSION: 1. Partially decompressed gallbladder with wall thickness at the upper limits of normal (2.9 mm). The patient was not NPO prior to the exam . 2. No gallstones, pericholecystic fluid, or sonographic Murphy sign. Electronically signed by: Waddell Calk MD 05/30/2024 07:03 AM EDT RP Workstation: [id number]   NM Hepato W/Eject Fract Result Date: 05/29/2024 CLINICAL DATA:  Abdominal pain, upper, chronic, assess gallbladder motility. EXAM: NUCLEAR MEDICINE HEPATOBILIARY IMAGING WITH GALLBLADDER EF TECHNIQUE: Sequential images of the abdomen were obtained out to 60 minutes following intravenous administration of radiopharmaceutical. After oral ingestion of Ensure, gallbladder ejection fraction was determined. At 60 min, normal ejection fraction is greater than 33%. RADIOPHARMACEUTICALS:  5.2 mCi Tc-58m  Choletec  IV COMPARISON:  CT July 08 2022 FINDINGS: Prompt uptake and biliary excretion of activity by the liver is seen. Gallbladder activity is  visualized, consistent with patency of cystic duct. Biliary activity passes into small bowel, consistent with patent common bile duct. Calculated gallbladder ejection fraction is 18%. (Normal gallbladder ejection fraction with Ensure is greater than 33% and less than 80%.) IMPRESSION: Reduced  gallbladder ejection fraction as can be seen with chronic cholecystitis/biliary dyskinesia. Electronically Signed   By: Reyes Holder M.D.   On: 05/29/2024 16:47   Assessment/Plan 43 y/o M with progressive RUQ pain associated with vomiting  HIDA yesterday by his PCP shows patent cystic duct, GB EF of 18%  RUQ U/S negative for gallstones or pericholecystic fluid  CT abdomen and pelvic negative for acute findings  He is afebrile, hemodynamically stable, with normal CBC and LFTs.   While it is atypical for a young male, without gallstones, I do suspect his gallbladder disease, potentially biliary dyskinesia, is the source of his symptoms. While there is no emergent nature to surgery, I think he would benefit from laparoscopic cholecystectomy. Will discuss with my attending but likely cholecystectomy today, possible discharge from PACU.    I reviewed nursing notes, ED provider notes, last 24 h vitals and pain scores, last 48 h intake and output, last 24 h labs and trends, and last 24 h imaging results.  Almarie GORMAN Pringle, [facility] 05/30/2024, 9:01 AM Please see Amion for pager number during day hours 7:00am-4:30pm or 7:00am -11:30am on weekends

## 2024-05-30 NOTE — ED Provider Notes (Signed)
 [place] EMERGENCY DEPARTMENT AT [facility] Provider Note   CSN: [id number] Arrival date & time: 05/30/24  0150     Patient presents with: Abdominal Pain   Eugene Butler is a 43 y.o. male.  Patient with past medical history significant for appendectomy, cervical radiculopathy, GERD, presents to the Emergency Department complaining of severe abdominal pain.  He states that he has had chronic pain thought to be related to his gallbladder for months.  Yesterday he had a nuclear medicine study which showed a gallbladder ejection fraction of 18%.  His primary care provider told him he would need surgery for removal.  He was going to wait to try to see the surgeon as an outpatient but the pain began to become very severe.  He also endorses severe nausea without emesis.  Patient denies fever, chest pain, shortness of breath.  Patient also was taking Wegovy  but states that the symptoms began long before the initiation of Wegovy .    Abdominal Pain      Prior to Admission medications   Medication Sig Start Date End Date Taking? Authorizing Provider  escitalopram (LEXAPRO) 5 MG tablet Take 5 mg by mouth daily.    [provider]  hydrOXYzine  (ATARAX ) 25 MG tablet Take 25 mg by mouth 3 (three) times daily as needed.    [provider]  prazosin (MINIPRESS) 2 MG capsule Take 2 mg by mouth at bedtime.    [provider]  promethazine  (PHENERGAN ) 12.5 MG tablet 1-2 tabs po q8h prn nausea 05/08/24   McGowen, Aleene DEL, MD  semaglutide -weight management (WEGOVY ) 1.7 MG/0.75ML SOAJ SQ injection Inject 1.7 mg into the skin once a week. 05/08/24   McGowen, Philip H, MD  venlafaxine (EFFEXOR) 75 MG tablet Take 75 mg by mouth 2 (two) times daily.    [provider]    Allergies: Naloxone and Pertussis vaccines    Review of Systems  Gastrointestinal:  Positive for abdominal pain.    Updated Vital Signs BP 111/71 (BP Location: Right Arm)   Pulse 66    Temp 98.1 F (36.7 C)   Resp 12   Ht 6' 1 (1.854 m)   Wt 135.4 kg   SpO2 100%   BMI 39.38 kg/m   Physical Exam Vitals and nursing note reviewed.  Constitutional:      General: He is not in acute distress.    Appearance: He is well-developed.  HENT:     Head: Normocephalic and atraumatic.  Eyes:     Conjunctiva/sclera: Conjunctivae normal.  Cardiovascular:     Rate and Rhythm: Normal rate and regular rhythm.     Heart sounds: No murmur heard. Pulmonary:     Effort: Pulmonary effort is normal. No respiratory distress.     Breath sounds: Normal breath sounds.  Abdominal:     Palpations: Abdomen is soft.     Tenderness: There is generalized abdominal tenderness.     Comments: Generalized abdominal tenderness, worse in the epigastric region.  Scar from previous appendectomy noted  Musculoskeletal:        General: No swelling.     Cervical back: Neck supple.  Skin:    General: Skin is warm and dry.     Capillary Refill: Capillary refill takes less than 2 seconds.  Neurological:     Mental Status: He is alert.  Psychiatric:        Mood and Affect: Mood normal.     (all labs ordered are listed, but only  abnormal results are displayed) Labs Reviewed  COMPREHENSIVE METABOLIC PANEL WITH GFR - Abnormal; Notable for the following components:      Result Value   CO2 21 (*)    Glucose, Bld 107 (*)    All other components within normal limits  I-STAT CHEM 8, ED - Abnormal; Notable for the following components:   Glucose, Bld 108 (*)    TCO2 21 (*)    All other components within normal limits  LIPASE, BLOOD  CBC  URINALYSIS, ROUTINE W REFLEX MICROSCOPIC    EKG: EKG Interpretation Date/Time:  Thursday May 30 2024 02:16:34 EDT Ventricular Rate:  74 PR Interval:  140 QRS Duration:  74 QT Interval:  380 QTC Calculation: 421 R Axis:   79  Text Interpretation: Normal sinus rhythm Normal ECG When compared with ECG of 08-Jun-2021 22:01, No significant change was found  Confirmed by Raford Lenis ([id number]) on 05/30/2024 2:41:48 AM  Radiology: NM Hepato W/Eject Fract Result Date: 05/29/2024 CLINICAL DATA:  Abdominal pain, upper, chronic, assess gallbladder motility. EXAM: NUCLEAR MEDICINE HEPATOBILIARY IMAGING WITH GALLBLADDER EF TECHNIQUE: Sequential images of the abdomen were obtained out to 60 minutes following intravenous administration of radiopharmaceutical. After oral ingestion of Ensure, gallbladder ejection fraction was determined. At 60 min, normal ejection fraction is greater than 33%. RADIOPHARMACEUTICALS:  5.2 mCi Tc-74m  Choletec  IV COMPARISON:  CT July 08 2022 FINDINGS: Prompt uptake and biliary excretion of activity by the liver is seen. Gallbladder activity is visualized, consistent with patency of cystic duct. Biliary activity passes into small bowel, consistent with patent common bile duct. Calculated gallbladder ejection fraction is 18%. (Normal gallbladder ejection fraction with Ensure is greater than 33% and less than 80%.) IMPRESSION: Reduced gallbladder ejection fraction as can be seen with chronic cholecystitis/biliary dyskinesia. Electronically Signed   By: Reyes Holder M.D.   On: 05/29/2024 16:47     Procedures   Medications Ordered in the ED  ketorolac  (TORADOL ) 30 MG/ML injection 30 mg (has no administration in time range)                                    Medical Decision Making  This patient presents to the ED for concern of abdominal pain, this involves an extensive number of treatment options, and is a complaint that carries with it a high risk of complications and morbidity.  The differential diagnosis includes biliary dyskinesia, cholecystitis, colitis, others   Co morbidities / Chronic conditions that complicate the patient evaluation  History of appendectomy   Additional history obtained:  Additional history obtained from EMR External records from outside source obtained and reviewed including nuclear medicine  study showing gallbladder ejection fraction of 18%   Lab Tests:  I Ordered, and personally interpreted labs.  The pertinent results include: Grossly unremarkable CMP, specifically normal bilirubin, normal liver enzymes.  No leukocytosis   Imaging Studies ordered:  I ordered imaging studies including right upper quadrant ultrasound   Cardiac Monitoring: / EKG:  The patient was maintained on a cardiac monitor.  I personally viewed and interpreted the cardiac monitored which showed an underlying rhythm of: Normal sinus rhythm   Problem List / ED Course / Critical interventions / Medication management   I ordered medication including as needed Toradol  as patient initially declines pain medication    Consultations Obtained:  I requested consultation with the general surgeon, Dr.Burton, and discussed lab and imaging findings as well  as pertinent plan - they recommend: RUQ ultrasound to rule out acute cholecystitis/cholangitis.  If negative, patient may benefit from CT abdomen pelvis to evaluate for other causes of his biliary dyskinesia   Social Determinants of Health:  Patient uses smokeless tobacco daily   Test / Admission - Considered:  Patient care being signed out to Larraine Christen, PA-C at shift handoff. Final disposition pending results of imaging and reassessment of patient.  Unless acute finding on repeat imaging, patient may need pain medication and outpatient follow-up with general surgery.      Final diagnoses:  Biliary dyskinesia    ED Discharge Orders     None          Logan Ubaldo KATHEE DEVONNA 05/30/24 0640    Raford Lenis, MD 05/30/24 [phone]

## 2024-05-31 ENCOUNTER — Encounter (HOSPITAL_COMMUNITY): Payer: Self-pay | Admitting: Surgery

## 2024-06-03 LAB — SURGICAL PATHOLOGY

## 2024-06-04 ENCOUNTER — Encounter: Payer: Self-pay | Admitting: Family Medicine

## 2024-08-07 ENCOUNTER — Ambulatory Visit: Admitting: Family Medicine

## 2024-08-07 ENCOUNTER — Encounter: Payer: Self-pay | Admitting: Family Medicine

## 2024-08-07 VITALS — BP 116/78 | HR 79 | Temp 97.8°F | Ht 72.0 in | Wt 288.4 lb

## 2024-08-07 DIAGNOSIS — Z0000 Encounter for general adult medical examination without abnormal findings: Secondary | ICD-10-CM | POA: Diagnosis not present

## 2024-08-07 DIAGNOSIS — Z7689 Persons encountering health services in other specified circumstances: Secondary | ICD-10-CM

## 2024-08-07 DIAGNOSIS — E66812 Obesity, class 2: Secondary | ICD-10-CM | POA: Diagnosis not present

## 2024-08-07 MED ORDER — WEGOVY 2.4 MG/0.75ML ~~LOC~~ SOAJ: 2.4000 mg | SUBCUTANEOUS | 5 refills | Status: AC

## 2024-08-07 NOTE — Progress Notes (Signed)
 Office Note 08/07/2024  CC:  Chief Complaint  Patient presents with   Medical Management of Chronic Issues    HPI:  Patient is a 43 y.o. male who is here for annual health maintenance exam and 88-month follow-up obesity/weight management. At the time of his last follow-up he was doing fine on Wegovy  1 mg weekly and I increased his dose to 1.7 mg/week.  Since I last saw him he has had his gallbladder removed for biliary dyskinesia.  Feeling well. He says that since having his gallbladder removed he has a little bit of intolerance to vegetables--> loose stool.  However, fried or fatty foods do not cause any problem.  He does not have any side effects from his Wegovy .   Past Medical History:  Diagnosis Date   Biliary dyskinesia    lap chole 05/2024   Cervical radiculopathy    Dr. Arnaldo   Fatty liver    u/s 05/2022   GERD (gastroesophageal reflux disease)    History of meningitis    childhood.  Question of seizures related to this, was on tegretol for a while per pt report   Hypothyroidism    Patient reports he was on levothyroxine until he self DC'd this medication the age of 58   Left shoulder pain    Dr. Arnaldo   Morbid obesity (HCC)    OSA (obstructive sleep apnea)    Skin of: Did home sleep study at 1 point but says he was never called about the results   Recurrent abdominal pain    Stress fracture of navicular bone of foot     Past Surgical History:  Procedure Laterality Date   ACHILLES TENDON SURGERY     APPENDECTOMY  01/02/2020   CHOLECYSTECTOMY N/A 05/30/2024   Procedure: LAPAROSCOPIC CHOLECYSTECTOMY;  Surgeon: Vernetta Berg, MD;  Location: [facility] OR;  Service: General;  Laterality: N/A;    Family History  Problem Relation Age of Onset   Arthritis Mother    Lung cancer Mother    Early death Mother    Kidney disease Brother     Social History   Socioeconomic History   Marital status: Widowed    Spouse name: Not on file   Number of children: Not  on file   Years of education: Not on file   Highest education level: Not on file  Occupational History   Not on file  Tobacco Use   Smoking status: Never   Smokeless tobacco: Current    Types: Snuff  Substance and Sexual Activity   Alcohol use: Yes    Comment: occ   Drug use: Never   Sexual activity: Yes    Partners: Female  Other Topics Concern   Not on file  Social History Narrative   Widower, 1 daughter.   Delivers mail as of 2023.   Originally from [place] , then [place] .   Denies use of tobacco alcohol or drugs.   Social Drivers of Corporate Investment Banker Strain: Not on file  Food Insecurity: Not on file  Transportation Needs: Not on file  Physical Activity: Not on file  Stress: Not on file  Social Connections: Not on file  Intimate Partner Violence: Not on file    Outpatient Medications Prior to Visit  Medication Sig Dispense Refill   acetaminophen  (TYLENOL ) 500 MG tablet Take 500-1,000 mg by mouth every 6 (six) hours as needed for moderate pain (pain score 4-6).     hydrOXYzine  (ATARAX ) 25 MG tablet Take 25  mg by mouth 2 (two) times daily.     mirtazapine (REMERON) 30 MG tablet Take 30 mg by mouth at bedtime.     prazosin (MINIPRESS) 1 MG capsule Take 1 mg by mouth at bedtime as needed (PTSD).     promethazine  (PHENERGAN ) 12.5 MG tablet 1-2 tabs po q8h prn nausea 30 tablet 2   semaglutide -weight management (WEGOVY ) 1.7 MG/0.75ML SOAJ SQ injection Inject 1.7 mg into the skin once a week. (Patient taking differently: Inject 1 mg into the skin once a week.) 3 mL 2   oxyCODONE  (ROXICODONE ) 5 MG immediate release tablet Take 1 tablet (5 mg total) by mouth every 4 (four) hours as needed for severe pain (pain score 7-10) or moderate pain (pain score 4-6). (Patient not taking: Reported on 08/07/2024) 20 tablet 0   No facility-administered medications prior to visit.    Allergies  Allergen Reactions   Naloxone Anaphylaxis   Pertussis Vaccines      Review of Systems  Constitutional:  Negative for appetite change, chills, fatigue and fever.  HENT:  Negative for congestion, dental problem, ear pain and sore throat.   Eyes:  Negative for discharge, redness and visual disturbance.  Respiratory:  Negative for cough, chest tightness, shortness of breath and wheezing.   Cardiovascular:  Negative for chest pain, palpitations and leg swelling.  Gastrointestinal:  Negative for abdominal pain, blood in stool, diarrhea, nausea and vomiting.  Genitourinary:  Negative for difficulty urinating, dysuria, flank pain, frequency, hematuria and urgency.  Musculoskeletal:  Negative for arthralgias, back pain, joint swelling, myalgias and neck stiffness.  Skin:  Negative for pallor and rash.  Neurological:  Negative for dizziness, speech difficulty, weakness and headaches.  Hematological:  Negative for adenopathy. Does not bruise/bleed easily.  Psychiatric/Behavioral:  Negative for confusion and sleep disturbance. The patient is not nervous/anxious.    PE;    08/07/2024    3:34 PM 05/30/2024    4:00 PM 05/30/2024    3:45 PM  Vitals with BMI  Height 6' 0    Weight 288 lbs 6 oz    BMI 39.11    Systolic 116 117 878  Diastolic 78 71 73  Pulse 79 68 61   Gen: Alert, well appearing.  Patient is oriented to person, place, time, and situation. AFFECT: pleasant, lucid thought and speech. ENT: Ears: EACs clear, normal epithelium.  TMs with good light reflex and landmarks bilaterally.  Eyes: no injection, icteris, swelling, or exudate.  EOMI, PERRLA. Nose: no drainage or turbinate edema/swelling.  No injection or focal lesion.  Mouth: lips without lesion/swelling.  Oral mucosa pink and moist.  Dentition intact and without obvious caries or gingival swelling.  Oropharynx without erythema, exudate, or swelling.  Neck: supple/nontender.  No LAD, mass, or TM.  Carotid pulses 2+ bilaterally, without bruits. CV: RRR, no m/r/g.   LUNGS: CTA bilat, nonlabored  resps, good aeration in all lung fields. ABD: soft, NT, ND, BS normal.  No hepatospenomegaly or mass.  No bruits. EXT: no clubbing, cyanosis, or edema.  Musculoskeletal: no joint swelling, erythema, warmth, or tenderness.  ROM of all joints intact. Skin - no sores or suspicious lesions or rashes or color changes  Pertinent labs:  Lab Results  Component Value Date   TSH 3.02 12/28/2023   Lab Results  Component Value Date   WBC 7.8 05/30/2024   HGB 14.3 05/30/2024   HCT 42.0 05/30/2024   MCV 86.1 05/30/2024   PLT 282 05/30/2024   Lab Results  Component  Value Date   CREATININE 1.00 05/30/2024   BUN 20 05/30/2024   NA 140 05/30/2024   K 4.0 05/30/2024   CL 106 05/30/2024   CO2 21 (L) 05/30/2024   Lab Results  Component Value Date   ALT 23 05/30/2024   AST 20 05/30/2024   ALKPHOS 70 05/30/2024   BILITOT 0.6 05/30/2024   Lab Results  Component Value Date   CHOL 195 12/28/2023   Lab Results  Component Value Date   HDL 46 12/28/2023   Lab Results  Component Value Date   LDLCALC 124 (H) 12/28/2023   Lab Results  Component Value Date   TRIG 134 12/28/2023   Lab Results  Component Value Date   CHOLHDL 4.2 12/28/2023   Lab Results  Component Value Date   HGBA1C 5.7 (H) 12/28/2023   ASSESSMENT AND PLAN:   #1 health maintenance exam: Reviewed age and gender appropriate health maintenance issues (prudent diet, regular exercise, health risks of tobacco and excessive alcohol, use of seatbelts, fire alarms in home, use of sunscreen).  Also reviewed age and gender appropriate health screening as well as vaccine recommendations. Vaccines: UTD Labs: None needed today Prostate ca screening: average risk patient= as per latest guidelines, start screening at 59 yrs of age. Colon ca screening: average risk patient= as per latest guidelines, start screening at 60 yrs of age. No labs needed  #2 weight management, BMI has improved from 41 to 39 on wegovy . Will increase to  the 2.4 mg weekly dose.  An After Visit Summary was printed and given to the patient.  FOLLOW UP:  Return in about 6 months (around 02/04/2025) for routine chronic illness f/u.  Signed:  Gerlene Hockey, MD           08/07/2024

## 2024-08-07 NOTE — Patient Instructions (Signed)
 Health Maintenance, Male  Adopting a healthy lifestyle and getting preventive care are important in promoting health and wellness. Ask your health care provider about:  The right schedule for you to have regular tests and exams.  Things you can do on your own to prevent diseases and keep yourself healthy.  What should I know about diet, weight, and exercise?  Eat a healthy diet    Eat a diet that includes plenty of vegetables, fruits, low-fat dairy products, and lean protein.  Do not eat a lot of foods that are high in solid fats, added sugars, or sodium.  Maintain a healthy weight  Body mass index (BMI) is a measurement that can be used to identify possible weight problems. It estimates body fat based on height and weight. Your health care provider can help determine your BMI and help you achieve or maintain a healthy weight.  Get regular exercise  Get regular exercise. This is one of the most important things you can do for your health. Most adults should:  Exercise for at least 150 minutes each week. The exercise should increase your heart rate and make you sweat (moderate-intensity exercise).  Do strengthening exercises at least twice a week. This is in addition to the moderate-intensity exercise.  Spend less time sitting. Even light physical activity can be beneficial.  Watch cholesterol and blood lipids  Have your blood tested for lipids and cholesterol at 42 years of age, then have this test every 5 years.  You may need to have your cholesterol levels checked more often if:  Your lipid or cholesterol levels are high.  You are older than 42 years of age.  You are at high risk for heart disease.  What should I know about cancer screening?  Many types of cancers can be detected early and may often be prevented. Depending on your health history and family history, you may need to have cancer screening at various ages. This may include screening for:  Colorectal cancer.  Prostate cancer.  Skin cancer.  Lung  cancer.  What should I know about heart disease, diabetes, and high blood pressure?  Blood pressure and heart disease  High blood pressure causes heart disease and increases the risk of stroke. This is more likely to develop in people who have high blood pressure readings or are overweight.  Talk with your health care provider about your target blood pressure readings.  Have your blood pressure checked:  Every 3-5 years if you are 24-52 years of age.  Every year if you are 3 years old or older.  If you are between the ages of 60 and 72 and are a current or former smoker, ask your health care provider if you should have a one-time screening for abdominal aortic aneurysm (AAA).  Diabetes  Have regular diabetes screenings. This checks your fasting blood sugar level. Have the screening done:  Once every three years after age 66 if you are at a normal weight and have a low risk for diabetes.  More often and at a younger age if you are overweight or have a high risk for diabetes.  What should I know about preventing infection?  Hepatitis B  If you have a higher risk for hepatitis B, you should be screened for this virus. Talk with your health care provider to find out if you are at risk for hepatitis B infection.  Hepatitis C  Blood testing is recommended for:  Everyone born from [birth year] through 1964.  Anyone  with known risk factors for hepatitis C.  Sexually transmitted infections (STIs)  You should be screened each year for STIs, including gonorrhea and chlamydia, if:  You are sexually active and are younger than 42 years of age.  You are older than 42 years of age and your health care provider tells you that you are at risk for this type of infection.  Your sexual activity has changed since you were last screened, and you are at increased risk for chlamydia or gonorrhea. Ask your health care provider if you are at risk.  Ask your health care provider about whether you are at high risk for HIV. Your health care provider  may recommend a prescription medicine to help prevent HIV infection. If you choose to take medicine to prevent HIV, you should first get tested for HIV. You should then be tested every 3 months for as long as you are taking the medicine.  Follow these instructions at home:  Alcohol use  Do not drink alcohol if your health care provider tells you not to drink.  If you drink alcohol:  Limit how much you have to 0-2 drinks a day.  Know how much alcohol is in your drink. In the [place] one drink equals one 12 oz bottle of beer (355 mL), one 5 oz glass of wine (148 mL), or one 1 oz glass of hard liquor (44 mL).  Lifestyle  Do not use any products that contain nicotine or tobacco. These products include cigarettes, chewing tobacco, and vaping devices, such as e-cigarettes. If you need help quitting, ask your health care provider.  Do not use street drugs.  Do not share needles.  Ask your health care provider for help if you need support or information about quitting drugs.  General instructions  Schedule regular health, dental, and eye exams.  Stay current with your vaccines.  Tell your health care provider if:  You often feel depressed.  You have ever been abused or do not feel safe at home.  Summary  Adopting a healthy lifestyle and getting preventive care are important in promoting health and wellness.  Follow your health care provider's instructions about healthy diet, exercising, and getting tested or screened for diseases.  Follow your health care provider's instructions on monitoring your cholesterol and blood pressure.  This information is not intended to replace advice given to you by your health care provider. Make sure you discuss any questions you have with your health care provider.  Document Revised: 01/25/2021 Document Reviewed: 01/25/2021  Elsevier Patient Education  2024 [employer].

## 2024-08-08 ENCOUNTER — Telehealth: Payer: Self-pay | Admitting: Pharmacy Technician

## 2024-08-08 ENCOUNTER — Other Ambulatory Visit (HOSPITAL_COMMUNITY): Payer: Self-pay

## 2024-08-08 NOTE — Telephone Encounter (Signed)
 Pharmacy Patient Advocate Encounter   Received notification from [employer]base that prior authorization for Wegovy  2.4MG /0.75ML auto-injectors  is required/requested.   Insurance verification completed.   The patient is insured through [facility].   Per test claim: PA required; PA submitted to above mentioned insurance via Latent Key/confirmation #/EOC [id number] Status is pending

## 2024-08-08 NOTE — Telephone Encounter (Signed)
 Pharmacy Patient Advocate Encounter  Received notification from [facility] that Prior Authorization for  Wegovy  2.4MG /0.75ML auto-injectors  has been APPROVED from 08/08/2024 to 03/08/2025   PA #/Case ID/Reference #: [id number]  PLEASE BE ADVISED APPROVAL LETTER HAS BEEN SCANNED IN MEDIA OF CHART

## 2025-02-05 ENCOUNTER — Ambulatory Visit: Admitting: Family Medicine
